# Patient Record
Sex: Male | Born: 1975 | Race: White | Hispanic: No | Marital: Married | Smoking: Former smoker
Health system: Southern US, Community
[De-identification: ages and names within clinical notes are randomized; demographics above are authoritative.]

## PROBLEM LIST (undated history)

## (undated) DIAGNOSIS — I10 Essential (primary) hypertension: Secondary | ICD-10-CM

## (undated) DIAGNOSIS — M1990 Unspecified osteoarthritis, unspecified site: Secondary | ICD-10-CM

## (undated) HISTORY — PX: FRACTURE SURGERY: SHX138

---

## 2004-11-01 ENCOUNTER — Other Ambulatory Visit: Payer: Self-pay

## 2004-11-01 ENCOUNTER — Emergency Department: Payer: Self-pay | Admitting: Emergency Medicine

## 2007-10-22 ENCOUNTER — Ambulatory Visit: Payer: Self-pay | Admitting: Family Medicine

## 2009-05-05 ENCOUNTER — Ambulatory Visit: Payer: Self-pay | Admitting: Family Medicine

## 2009-11-14 ENCOUNTER — Emergency Department: Payer: Self-pay | Admitting: Internal Medicine

## 2010-01-16 ENCOUNTER — Ambulatory Visit: Payer: Self-pay | Admitting: Family Medicine

## 2010-01-22 ENCOUNTER — Ambulatory Visit: Payer: Self-pay | Admitting: Internal Medicine

## 2010-01-25 ENCOUNTER — Ambulatory Visit: Payer: Self-pay | Admitting: Internal Medicine

## 2010-01-30 ENCOUNTER — Ambulatory Visit: Payer: Self-pay | Admitting: Internal Medicine

## 2010-02-05 ENCOUNTER — Ambulatory Visit: Payer: Self-pay | Admitting: Internal Medicine

## 2010-02-12 ENCOUNTER — Ambulatory Visit: Payer: Self-pay | Admitting: Internal Medicine

## 2010-02-16 ENCOUNTER — Ambulatory Visit: Payer: Self-pay | Admitting: Internal Medicine

## 2010-02-26 ENCOUNTER — Ambulatory Visit: Payer: Self-pay

## 2012-01-23 ENCOUNTER — Ambulatory Visit: Payer: Self-pay | Admitting: Family Medicine

## 2012-10-31 ENCOUNTER — Ambulatory Visit: Payer: Self-pay | Admitting: Orthopedic Surgery

## 2014-08-23 ENCOUNTER — Other Ambulatory Visit: Payer: Self-pay | Admitting: Orthopedic Surgery

## 2014-08-23 DIAGNOSIS — M25562 Pain in left knee: Secondary | ICD-10-CM

## 2014-08-25 ENCOUNTER — Ambulatory Visit
Admission: RE | Admit: 2014-08-25 | Discharge: 2014-08-25 | Disposition: A | Discharge status: Home / Self Care | Payer: BLUE CROSS/BLUE SHIELD | Source: Ambulatory Visit | Attending: Orthopedic Surgery | Admitting: Orthopedic Surgery

## 2014-08-25 DIAGNOSIS — M25562 Pain in left knee: Secondary | ICD-10-CM

## 2014-08-25 DIAGNOSIS — X58XXXA Exposure to other specified factors, initial encounter: Secondary | ICD-10-CM | POA: Diagnosis not present

## 2014-08-25 DIAGNOSIS — S83242A Other tear of medial meniscus, current injury, left knee, initial encounter: Secondary | ICD-10-CM | POA: Insufficient documentation

## 2014-08-25 DIAGNOSIS — M25462 Effusion, left knee: Secondary | ICD-10-CM | POA: Insufficient documentation

## 2014-08-25 DIAGNOSIS — S83512D Sprain of anterior cruciate ligament of left knee, subsequent encounter: Secondary | ICD-10-CM | POA: Insufficient documentation

## 2014-08-27 ENCOUNTER — Ambulatory Visit: Payer: Self-pay

## 2014-09-26 ENCOUNTER — Encounter: Payer: Self-pay | Admitting: *Deleted

## 2014-09-26 ENCOUNTER — Inpatient Hospital Stay: Admission: RE | Admit: 2014-09-26 | Payer: BLUE CROSS/BLUE SHIELD | Source: Ambulatory Visit

## 2014-09-26 DIAGNOSIS — X58XXXA Exposure to other specified factors, initial encounter: Secondary | ICD-10-CM | POA: Diagnosis not present

## 2014-09-26 DIAGNOSIS — Z9889 Other specified postprocedural states: Secondary | ICD-10-CM | POA: Diagnosis not present

## 2014-09-26 DIAGNOSIS — S83232A Complex tear of medial meniscus, current injury, left knee, initial encounter: Secondary | ICD-10-CM | POA: Diagnosis not present

## 2014-09-26 DIAGNOSIS — I10 Essential (primary) hypertension: Secondary | ICD-10-CM | POA: Diagnosis not present

## 2014-09-26 DIAGNOSIS — S83512A Sprain of anterior cruciate ligament of left knee, initial encounter: Secondary | ICD-10-CM | POA: Diagnosis not present

## 2014-09-26 DIAGNOSIS — S83242A Other tear of medial meniscus, current injury, left knee, initial encounter: Secondary | ICD-10-CM | POA: Diagnosis present

## 2014-09-26 DIAGNOSIS — M948X6 Other specified disorders of cartilage, lower leg: Secondary | ICD-10-CM | POA: Diagnosis not present

## 2014-09-26 DIAGNOSIS — M1990 Unspecified osteoarthritis, unspecified site: Secondary | ICD-10-CM | POA: Diagnosis not present

## 2014-09-26 DIAGNOSIS — Z87891 Personal history of nicotine dependence: Secondary | ICD-10-CM | POA: Diagnosis not present

## 2014-09-26 DIAGNOSIS — M25562 Pain in left knee: Secondary | ICD-10-CM | POA: Diagnosis not present

## 2014-09-26 NOTE — Patient Instructions (Signed)
  Your procedure is scheduled on: 09-29-14 Report to [facility] 2ND FLOOR To find out your arrival time please call [phone] between 1PM - 3PM on 09-28-14 [facility](WEDNESDAY)  Remember: Instructions that are not followed completely may result in serious medical risk, up to and including death, or upon the discretion of your surgeon and anesthesiologist your surgery may need to be rescheduled.    _X___ 1. Do not eat food or drink liquids after midnight. No gum chewing or hard candies.     _X___ 2. No Alcohol for 24 hours before or after surgery.   ____ 3. Bring all medications with you on the day of surgery if instructed.    _X___ 4. Notify your doctor if there is any change in your medical condition     (cold, fever, infections).     Do not wear jewelry, make-up, hairpins, clips or nail polish.  Do not wear lotions, powders, or perfumes. You may wear deodorant.  Do not shave 48 hours prior to surgery. Men may shave face and neck.  Do not bring valuables to the hospital.    [facility] for any belongings or valuables.               Contacts, dentures or bridgework may not be worn into surgery.  Leave your suitcase in the car. After surgery it may be brought to your room.  For patients admitted to the hospital, discharge time is determined by your treatment team.   Patients discharged the day of surgery will not be allowed to drive home.   Please read over the following fact sheets that you were given:      ____ Take these medicines the morning of surgery with A SIP OF WATER:    1. NONE  2.   3.   4.  5.  6.  ____ Fleet Enema (as directed)   _X___ Use CHG Soap as directed  ____ Use inhalers on the day of surgery  ____ Stop metformin 2 days prior to surgery    ____ Take 1/2 of usual insulin dose the night before surgery and none on the morning of surgery.   ____ Stop Coumadin/Plavix/aspirin-N/A  ____ Stop Anti-inflammatories-NO NSAIDS  OR ASPIRIN PRODUCTS-TYLENOL OK   _X___ Stop supplements until after surgery-STOP VITAMIN C,E AND FISH OIL NOW  ____ Bring C-Pap to the hospital.

## 2014-09-28 ENCOUNTER — Encounter
Admission: RE | Admit: 2014-09-28 | Discharge: 2014-09-28 | Disposition: A | Discharge status: Home / Self Care | Payer: BLUE CROSS/BLUE SHIELD | Source: Ambulatory Visit | Attending: Orthopedic Surgery | Admitting: Orthopedic Surgery

## 2014-09-28 DIAGNOSIS — S83232A Complex tear of medial meniscus, current injury, left knee, initial encounter: Secondary | ICD-10-CM | POA: Diagnosis not present

## 2014-09-28 LAB — BASIC METABOLIC PANEL
ANION GAP: 7 (ref 5–15)
BUN: 19 mg/dL (ref 6–20)
CO2: 28 mmol/L (ref 22–32)
CREATININE: 1.01 mg/dL (ref 0.61–1.24)
Calcium: 9.4 mg/dL (ref 8.9–10.3)
Chloride: 100 mmol/L — ABNORMAL LOW (ref 101–111)
GFR calc non Af Amer: >60 mL/min (ref >60)
GLUCOSE: 113 mg/dL — AB (ref 65–99)
POTASSIUM: 4.1 mmol/L (ref 3.5–5.1)
Sodium: 135 mmol/L (ref 135–145)

## 2014-09-28 LAB — URINALYSIS COMPLETE WITH MICROSCOPIC (ARMC ONLY)
BACTERIA UA: NONE SEEN
Bilirubin Urine: NEGATIVE
GLUCOSE, UA: NEGATIVE mg/dL
HGB URINE DIPSTICK: NEGATIVE
Ketones, ur: NEGATIVE mg/dL
LEUKOCYTES UA: NEGATIVE
NITRITE: NEGATIVE
Protein, ur: NEGATIVE mg/dL
RBC / HPF: NONE SEEN RBC/hpf (ref 0–5)
Specific Gravity, Urine: 1.005 (ref 1.005–1.030)
pH: 6 (ref 5.0–8.0)

## 2014-09-28 LAB — CBC
HCT: 43.3 % (ref 40.0–52.0)
Hemoglobin: 14.5 g/dL (ref 13.0–18.0)
MCH: 31.2 pg (ref 26.0–34.0)
MCHC: 33.6 g/dL (ref 32.0–36.0)
MCV: 92.9 fL (ref 80.0–100.0)
PLATELETS: 181 10*3/uL (ref 150–440)
RBC: 4.66 MIL/uL (ref 4.40–5.90)
RDW: 12.5 % (ref 11.5–14.5)
WBC: 6.5 10*3/uL (ref 3.8–10.6)

## 2014-09-28 LAB — APTT: aPTT: 29 seconds (ref 24–36)

## 2014-09-28 LAB — PROTIME-INR
INR: 1.05
Prothrombin Time: 13.9 seconds (ref 11.4–15.0)

## 2014-09-29 ENCOUNTER — Encounter
Admission: RE | Disposition: A | Discharge status: Home / Self Care | Payer: Self-pay | Source: Ambulatory Visit | Attending: Orthopedic Surgery

## 2014-09-29 ENCOUNTER — Ambulatory Visit: Payer: BLUE CROSS/BLUE SHIELD | Admitting: Anesthesiology

## 2014-09-29 ENCOUNTER — Encounter: Payer: Self-pay | Admitting: Anesthesiology

## 2014-09-29 ENCOUNTER — Ambulatory Visit
Admission: RE | Admit: 2014-09-29 | Discharge: 2014-09-29 | Disposition: A | Discharge status: Home / Self Care | Payer: BLUE CROSS/BLUE SHIELD | Source: Ambulatory Visit | Attending: Orthopedic Surgery | Admitting: Orthopedic Surgery

## 2014-09-29 DIAGNOSIS — M948X6 Other specified disorders of cartilage, lower leg: Secondary | ICD-10-CM | POA: Insufficient documentation

## 2014-09-29 DIAGNOSIS — I10 Essential (primary) hypertension: Secondary | ICD-10-CM | POA: Insufficient documentation

## 2014-09-29 DIAGNOSIS — Z9889 Other specified postprocedural states: Secondary | ICD-10-CM | POA: Insufficient documentation

## 2014-09-29 DIAGNOSIS — Z87891 Personal history of nicotine dependence: Secondary | ICD-10-CM | POA: Insufficient documentation

## 2014-09-29 DIAGNOSIS — M1990 Unspecified osteoarthritis, unspecified site: Secondary | ICD-10-CM | POA: Insufficient documentation

## 2014-09-29 DIAGNOSIS — S83512A Sprain of anterior cruciate ligament of left knee, initial encounter: Secondary | ICD-10-CM | POA: Insufficient documentation

## 2014-09-29 DIAGNOSIS — S83232A Complex tear of medial meniscus, current injury, left knee, initial encounter: Secondary | ICD-10-CM | POA: Insufficient documentation

## 2014-09-29 DIAGNOSIS — M25562 Pain in left knee: Secondary | ICD-10-CM | POA: Insufficient documentation

## 2014-09-29 DIAGNOSIS — X58XXXA Exposure to other specified factors, initial encounter: Secondary | ICD-10-CM | POA: Insufficient documentation

## 2014-09-29 HISTORY — PX: KNEE ARTHROSCOPY WITH ANTERIOR CRUCIATE LIGAMENT (ACL) REPAIR: SHX5644

## 2014-09-29 HISTORY — DX: Unspecified osteoarthritis, unspecified site: M19.90

## 2014-09-29 HISTORY — DX: Essential (primary) hypertension: I10

## 2014-09-29 SURGERY — KNEE ARTHROSCOPY WITH ANTERIOR CRUCIATE LIGAMENT (ACL) REPAIR
Anesthesia: General | Site: Knee | Laterality: Left | Wound class: Clean

## 2014-09-29 MED ORDER — BUPIVACAINE HCL (PF) 0.25 % IJ SOLN
INTRAMUSCULAR | Status: DC | PRN
  Administered 2014-09-29: 30 mL

## 2014-09-29 MED ORDER — LIDOCAINE HCL (CARDIAC) 20 MG/ML IV SOLN
INTRAVENOUS | Status: DC | PRN
  Administered 2014-09-29: 30 mg via INTRAVENOUS

## 2014-09-29 MED ORDER — LIDOCAINE HCL 1 % IJ SOLN
INTRAMUSCULAR | Status: DC | PRN
  Administered 2014-09-29: 4 mL

## 2014-09-29 MED ORDER — FENTANYL CITRATE (PF) 100 MCG/2ML IJ SOLN
INTRAMUSCULAR | Status: DC | PRN
  Administered 2014-09-29 (×2): 50 ug via INTRAVENOUS

## 2014-09-29 MED ORDER — FAMOTIDINE 20 MG PO TABS
20.0000 mg | ORAL_TABLET | Freq: Once | ORAL | Status: AC
  Administered 2014-09-29: 20 mg via ORAL

## 2014-09-29 MED ORDER — LACTATED RINGERS IV SOLN
INTRAVENOUS | Status: DC | PRN
  Administered 2014-09-29: 10:00:00 via INTRAVENOUS

## 2014-09-29 MED ORDER — BUPIVACAINE HCL (PF) 0.25 % IJ SOLN
INTRAMUSCULAR | Status: AC
  Filled 2014-09-29: qty 30

## 2014-09-29 MED ORDER — FENTANYL CITRATE (PF) 100 MCG/2ML IJ SOLN
25.0000 ug | INTRAMUSCULAR | Status: DC | PRN
  Administered 2014-09-29 (×4): 25 ug via INTRAVENOUS

## 2014-09-29 MED ORDER — CEFAZOLIN SODIUM-DEXTROSE 2-3 GM-% IV SOLR
INTRAVENOUS | Status: AC
  Filled 2014-09-29: qty 50

## 2014-09-29 MED ORDER — LIDOCAINE HCL (PF) 1 % IJ SOLN
INTRAMUSCULAR | Status: AC
  Filled 2014-09-29: qty 30

## 2014-09-29 MED ORDER — ONDANSETRON HCL 4 MG/2ML IJ SOLN
INTRAMUSCULAR | Status: DC | PRN
  Administered 2014-09-29: 4 mg via INTRAVENOUS

## 2014-09-29 MED ORDER — ASPIRIN EC 325 MG PO TBEC: 325.0000 mg | DELAYED_RELEASE_TABLET | Freq: Every day | ORAL | Status: DC

## 2014-09-29 MED ORDER — FENTANYL CITRATE (PF) 100 MCG/2ML IJ SOLN
INTRAMUSCULAR | Status: AC
  Administered 2014-09-29: 25 ug via INTRAVENOUS
  Filled 2014-09-29: qty 2

## 2014-09-29 MED ORDER — EPINEPHRINE 30 MG/30ML IJ SOLN
INTRAMUSCULAR | Status: DC | PRN
  Administered 2014-09-29: 12 mL

## 2014-09-29 MED ORDER — PROMETHAZINE HCL 12.5 MG PO TABS: 12.5000 mg | ORAL_TABLET | ORAL | Status: DC | PRN

## 2014-09-29 MED ORDER — NEOMYCIN-POLYMYXIN B GU 40-200000 IR SOLN
Status: AC
  Filled 2014-09-29: qty 4

## 2014-09-29 MED ORDER — MIDAZOLAM HCL 2 MG/2ML IJ SOLN
INTRAMUSCULAR | Status: DC | PRN
  Administered 2014-09-29: 2 mg via INTRAVENOUS

## 2014-09-29 MED ORDER — NEOMYCIN-POLYMYXIN B GU 40-200000 IR SOLN
Status: DC | PRN
  Administered 2014-09-29: 4 mL

## 2014-09-29 MED ORDER — ACETAMINOPHEN 325 MG PO TABS: 650.0000 mg | ORAL_TABLET | ORAL | Status: DC | PRN

## 2014-09-29 MED ORDER — LACTATED RINGERS IV SOLN
Freq: Once | INTRAVENOUS | Status: AC
  Administered 2014-09-29: 09:00:00 via INTRAVENOUS

## 2014-09-29 MED ORDER — PROPOFOL 10 MG/ML IV BOLUS
INTRAVENOUS | Status: DC | PRN
  Administered 2014-09-29: 150 mg via INTRAVENOUS

## 2014-09-29 MED ORDER — OXYCODONE HCL 5 MG PO TABS
5.0000 mg | ORAL_TABLET | ORAL | Status: DC | PRN
  Administered 2014-09-29: 5 mg via ORAL

## 2014-09-29 MED ORDER — OXYCODONE HCL 5 MG PO TABS: 5.0000 mg | ORAL_TABLET | ORAL | Status: DC | PRN

## 2014-09-29 MED ORDER — CEFAZOLIN SODIUM-DEXTROSE 2-3 GM-% IV SOLR
2.0000 g | Freq: Once | INTRAVENOUS | Status: AC
  Administered 2014-09-29: 2 g via INTRAVENOUS

## 2014-09-29 MED ORDER — GLYCOPYRROLATE 0.2 MG/ML IJ SOLN
INTRAMUSCULAR | Status: DC | PRN
  Administered 2014-09-29: 0.2 mg via INTRAVENOUS

## 2014-09-29 MED ORDER — ONDANSETRON HCL 4 MG/2ML IJ SOLN: 4.0000 mg | Freq: Once | INTRAMUSCULAR | Status: DC | PRN

## 2014-09-29 MED ORDER — FAMOTIDINE 20 MG PO TABS
ORAL_TABLET | ORAL | Status: AC
  Administered 2014-09-29: 20 mg via ORAL
  Filled 2014-09-29: qty 1

## 2014-09-29 MED ORDER — EPINEPHRINE HCL 1 MG/ML IJ SOLN
INTRAMUSCULAR | Status: AC
  Filled 2014-09-29: qty 1

## 2014-09-29 MED ORDER — OXYCODONE HCL 5 MG PO TABS
ORAL_TABLET | ORAL | Status: AC
  Filled 2014-09-29: qty 1

## 2014-09-29 SURGICAL SUPPLY — 82 items
ADAPTER IRRIG TUBE 2 SPIKE SOL (ADAPTER) ×4 IMPLANT
ANCHOR BUTTON TIGHTROPE ACL RT (Orthopedic Implant) ×2 IMPLANT
ANCHOR SUPER #2 ORTHOCORD (MISCELLANEOUS) IMPLANT
BASIN GRAD PLASTIC 32OZ STRL (MISCELLANEOUS) ×2 IMPLANT
BIT DRILL PIN RETRO (DRILL) ×1 IMPLANT
BLADE SURG 15 STRL LF DISP TIS (BLADE) ×2 IMPLANT
BLADE SURG 15 STRL SS (BLADE) ×2
BLADE SURG SZ11 CARB STEEL (BLADE) ×2 IMPLANT
BNDG COHESIVE 4X5 TAN STRL (GAUZE/BANDAGES/DRESSINGS) ×2 IMPLANT
BNDG COHESIVE 6X5 TAN STRL LF (GAUZE/BANDAGES/DRESSINGS) ×2 IMPLANT
BNDG ESMARK 6X12 TAN STRL LF (GAUZE/BANDAGES/DRESSINGS) IMPLANT
BUR RADIUS 3.5 (BURR) IMPLANT
BUR RADIUS 4.0X18.5 (BURR) ×2 IMPLANT
BUR RADIUS 5.5 (BURR) ×2 IMPLANT
CLEANER CAUTERY TIP 5X5 PAD (MISCELLANEOUS) ×1 IMPLANT
COOLER POLAR GLACIER W/PUMP (MISCELLANEOUS) ×2 IMPLANT
CUTTER DUAL RETRO 10.5 (CUTTER) ×2 IMPLANT
CUTTER REFRO DUAL 10MM (CUTTER) ×2 IMPLANT
DRAPE FLUOR MINI C-ARM 54X84 (DRAPES) ×2 IMPLANT
DRAPE IMP U-DRAPE 54X76 (DRAPES) ×4 IMPLANT
DRAPE INCISE IOBAN 66X45 STRL (DRAPES) IMPLANT
DRAPE SHEET LG 3/4 BI-LAMINATE (DRAPES) ×2 IMPLANT
DRAPE TABLE BACK 80X90 (DRAPES) ×2 IMPLANT
DRAPE U-SHAPE 47X51 STRL (DRAPES) IMPLANT
DRILL FLIPCUTTER II 8.0MM (INSTRUMENTS) IMPLANT
DRILL FLIPCUTTER II 9.0MM (INSTRUMENTS) ×1 IMPLANT
DRILL PIN RETRO (DRILL) ×2
DURAPREP 26ML APPLICATOR (WOUND CARE) ×4 IMPLANT
FLIPCUTTER II 8.0MM (INSTRUMENTS)
FLIPCUTTER II 9.0MM (INSTRUMENTS) ×2
GAUZE PETRO XEROFOAM 1X8 (MISCELLANEOUS) IMPLANT
GAUZE SPONGE 4X4 12PLY STRL (GAUZE/BANDAGES/DRESSINGS) ×2 IMPLANT
GLOVE BIOGEL PI IND STRL 9 (GLOVE) ×3 IMPLANT
GLOVE BIOGEL PI INDICATOR 9 (GLOVE) ×3
GLOVE SURG 9.0 ORTHO LTXF (GLOVE) ×8 IMPLANT
GOWN STRL REUS W/ TWL LRG LVL3 (GOWN DISPOSABLE) ×2 IMPLANT
GOWN STRL REUS W/TWL 2XL LVL3 (GOWN DISPOSABLE) ×2 IMPLANT
GOWN STRL REUS W/TWL LRG LVL3 (GOWN DISPOSABLE) ×2
GUIDEWIRE 1.1MM (WIRE) ×2 IMPLANT
HANDLE YANKAUER SUCT BULB TIP (MISCELLANEOUS) ×2 IMPLANT
IV LACTATED RINGER IRRG 3000ML (IV SOLUTION) ×12
IV LR IRRIG 3000ML ARTHROMATIC (IV SOLUTION) ×12 IMPLANT
KIT RM TURNOVER STRD PROC AR (KITS) ×2 IMPLANT
LABEL OR SOLS (LABEL) ×2 IMPLANT
MAT BLUE FLOOR 46X72 FLO (MISCELLANEOUS) ×4 IMPLANT
NDL SAFETY ECLIPSE 18X1.5 (NEEDLE) ×1 IMPLANT
NEEDLE FILTER BLUNT 18X 1/2SAF (NEEDLE) ×1
NEEDLE FILTER BLUNT 18X1 1/2 (NEEDLE) ×1 IMPLANT
NEEDLE HYPO 18GX1.5 SHARP (NEEDLE) ×1
NEPTUNE MANIFOLD (MISCELLANEOUS) ×2 IMPLANT
PACK ARTHROSCOPY KNEE (MISCELLANEOUS) ×2 IMPLANT
PAD ABD DERMACEA PRESS 5X9 (GAUZE/BANDAGES/DRESSINGS) IMPLANT
PAD CLEANER CAUTERY TIP 5X5 (MISCELLANEOUS) ×1
PAD GROUND ADULT SPLIT (MISCELLANEOUS) ×2 IMPLANT
PAD WRAPON POLAR KNEE (MISCELLANEOUS) ×1 IMPLANT
PENCIL ELECTRO HAND CTR (MISCELLANEOUS) ×2 IMPLANT
SCREW BIO FULL THREADED 10X28 (Screw) ×2 IMPLANT
SET TUBE SUCT SHAVER OUTFL 24K (TUBING) ×2 IMPLANT
SET TUBE TIP INTRA-ARTICULAR (MISCELLANEOUS) ×2 IMPLANT
STAPLE SPIKE LIGAMENT 11X20MM (Staple) ×2 IMPLANT
STRIP CLOSURE SKIN 1/2X4 (GAUZE/BANDAGES/DRESSINGS) ×4 IMPLANT
SUCTION FRAZIER TIP 10 FR DISP (SUCTIONS) ×2 IMPLANT
SUT 2 FIBERLOOP 20 STRT BLUE (SUTURE) ×4
SUT ETHILON 4-0 (SUTURE) ×1
SUT ETHILON 4-0 FS2 18XMFL BLK (SUTURE) ×1
SUT FIBERWIRE #2 38 T-5 BLUE (SUTURE) ×4
SUT MNCRL AB 4-0 PS2 18 (SUTURE) ×2 IMPLANT
SUT ORTHOCORD 2X36 W/O NDL (SUTURE) IMPLANT
SUT VIC AB 0 CT1 36 (SUTURE) ×2 IMPLANT
SUT VIC AB 2-0 CT2 27 (SUTURE) ×2 IMPLANT
SUT VIC AB 2-0 SH 27 (SUTURE)
SUT VIC AB 2-0 SH 27XBRD (SUTURE) IMPLANT
SUTURE 2 FIBERLOOP 20 STRT BLU (SUTURE) ×2 IMPLANT
SUTURE ETHLN 4-0 FS2 18XMF BLK (SUTURE) ×1 IMPLANT
SUTURE FIBERWR #2 38 T-5 BLUE (SUTURE) ×2 IMPLANT
SYR BULB IRRIG 60ML STRL (SYRINGE) ×2 IMPLANT
SYRINGE 10CC LL (SYRINGE) ×4 IMPLANT
TAPE UMBIL 1/8X18 RADIOPA (MISCELLANEOUS) ×2 IMPLANT
TUBING ARTHRO INFLOW-ONLY STRL (TUBING) ×2 IMPLANT
TUBING CONNECTING 10 (TUBING) IMPLANT
WAND HAND CNTRL MULTIVAC 90 (MISCELLANEOUS) ×2 IMPLANT
WRAPON POLAR PAD KNEE (MISCELLANEOUS) ×2

## 2014-09-29 NOTE — Anesthesia Preprocedure Evaluation (Addendum)
Anesthesia Evaluation  Patient identified by MRN, date of birth, ID band Patient awake    Reviewed: Allergy & Precautions, NPO status , Patient's Chart, lab work & pertinent test results, reviewed documented beta blocker date and time   Airway Mallampati: II  TM Distance: >3 FB     Dental  (+) Chipped   Pulmonary former smoker,          Cardiovascular hypertension,     Neuro/Psych    GI/Hepatic   Endo/Other    Renal/GU      Musculoskeletal  (+) Arthritis -,   Abdominal   Peds  Hematology   Anesthesia Other Findings   Reproductive/Obstetrics                            Anesthesia Physical Anesthesia Plan  ASA: II  Anesthesia Plan: General   Post-op Pain Management:    Induction: Intravenous  Airway Management Planned: LMA  Additional Equipment:   Intra-op Plan:   Post-operative Plan:   Informed Consent: I have reviewed the patients History and Physical, chart, labs and discussed the procedure including the risks, benefits and alternatives for the proposed anesthesia with the patient or authorized representative who has indicated his/her understanding and acceptance.     Plan Discussed with: CRNA  Anesthesia Plan Comments:        Anesthesia Quick Evaluation

## 2014-09-29 NOTE — Anesthesia Procedure Notes (Signed)
Procedure Name: LMA Insertion Date/Time: 09/29/2014 10:20 AM Performed by: Charna BusmanIAMOND, Gaudencio Chesnut Pre-anesthesia Checklist: Patient identified, Emergency Drugs available, Suction available, Patient being monitored and Timeout performed Patient Re-evaluated:Patient Re-evaluated prior to inductionOxygen Delivery Method: Circle system utilized Preoxygenation: Pre-oxygenation with 100% oxygen Intubation Type: Combination inhalational/ intravenous induction Ventilation: Mask ventilation without difficulty LMA: LMA inserted LMA Size: 5.0 Grade View: Grade II Tube type: Oral Number of attempts: 1 Tube secured with: Tape Dental Injury: Teeth and Oropharynx as per pre-operative assessment

## 2014-09-29 NOTE — OR Nursing (Signed)
Toes warm and pink pedal pulses positive

## 2014-09-29 NOTE — Op Note (Signed)
09/29/2014  2:15 PM  PATIENT:  Kenneth Simmons    PRE-OPERATIVE DIAGNOSIS:  COMPLETE TEAR,KNEE,ANTERIOR CRUCIATE LIGAMENT, medial meniscus tear and grade IV chondral lesion medial femoral condyle,  LEFT KNEE  POST-OPERATIVE DIAGNOSIS:  Same  PROCEDURE:  LEFT KNEE ARTHROSCOPY WITH ANTERIOR CRUCIATE LIGAMENT (ACL) REPAIR WITH HAMSTRING AUTOGRAFT, PARTIAL MEDIAL MENISCECTOMY AND MICROFRACTURE OF MEDIAL FEMORAL CONDYLE  SURGEON:  Juanell FairlyKRASINSKI, Manie Bealer, MD  ANESTHESIA:   General  PREOPERATIVE INDICATIONS:  Kenneth Simmons is a  39 y.o. male with a diagnosis of COMPLETE TEAR,KNEE,ANTERIOR CRUCIATE LIGAMENT LEFT KNEE who failed conservative measures and elected for surgical management due to persistent pain and instability of the left knee.  The risks benefits and alternatives were discussed with the patient preoperatively including but not limited to the risks of infection, bleeding, nerve or blood vessel injury nerve injury, stiffness, persistent pain or instability,, osteoarthritis, hardware failure, re-tear of the anterior cruciate ligament or meniscus and the need for further surgery. Medical risks include but are not limited to DVT and pulmonary embolism, myocardial infarction, stroke, pneumonia, respiratory failure and death and the potential for use of a allograft and related disease transmission risks.  OPERATIVE IMPLANTS: Arthrex anterior cruciate ligament tightrope RT, 10 x 28 mm bio composite tibial interference screw and 11mm spiked staple.  OPERATIVE FINDINGS: The anterior cruciate ligament was completely torn. The PCL was intact. The medial meniscus had a complex tear involving the posterior horn. There was a grade 4, full-thickness chondral lesion to the weightbearing surface of the medial femoral condyle.  OPERATIVE PROCEDURE: The patient was brought to the operating room and placed in the supine position. General anesthesia was administered. IV antibiotics were given. The lower extremity was  prepped and draped in usual sterile fashion. Exam under anesthesia demonstrated the above-named findings. Time out was performed.  Arthroscopy portals were then created in a vertical fashion on either side of patellar tendon. The arthroscope was placed in the lateral portal and full diagnostic examination the knee was undertaken. The findings are noted above.  The medial meniscus tear was treated using a straight duckbill biter and a 40 resector shaver blade. The meniscus was contoured and all meniscal debris was removed.  The anterior cruciate ligament stump was then debrided again with a 4 resector shaver blade. A 5.5 mm resector shaver blade was used to perform a notchplasty. The lateral compartment was visualized and was not found to have any evidence of chondral injury or meniscal tear. All arthroscopic instruments were removed.  An vertical incision was then made over the anterior tibia. The soft tissue was dissected using Metzenbaum scissor and pickup. The sartorius fascia was identified and incised and reflected to allow for visualization of the hamstring tendons.  The semitendinosus and gracilis were harvested using a tendon stripper. These were prepared on the back table and sutured with a fiber loop. The combined diameter of the tendons was measured to be 9 mm on the femoral side and 10 mm on the tibial side. The total graft length for both the semitendinosus and gracilis was 220 mm. . Good-quality tissue was obtained. The sartorius fascia was performed preserved for repair later.  The  Femoral drill guide was then placed through the lateral portal appeared case small incision was made over the lateral femur to allow for placement of this drill guide along the femoral cortex.   A flip cutter guide was then used to create a 9 mm diameter femoral tunnel.  Care was taken to maintain the  cortical bridge.  A fiber stick suture was then shuttled through the femoral drill guide to allow for later  shuttling of the graft through the knee. This was brought out through the tibial tunnel once that was created.   I then drilled the tibial tunnel using the size 10 retro-cutter. All the soft tissue remnants were removed and cleaned at the aperture of the tunnel. the tunnel was dilated to 10 mm.   The passing suture was delivered through the tibia, and then the Arthrex RT  button and graft were delivered up into the femoral tunnel the of the tibial tunnel. .  The button was flipped and confirmed under live fluoroscopy. I then tensioned the anterior cruciate ligament tightrope, and advanced the graft up into the femoral tunnel. Over 30 mm of graft was in the femoral tunnel. I confirmed once more with the fluoroscopy that the button was flipped appropriately on the femoral cortex.  I then cycled the knee, eliminated all of the creep. I then applied  counter tension to the graft from the tibial side as an  Arthrex bio composite interference screw was advanced into the tibial tunnel while a posterior drawer force was applied to the tibia. I removed the nitinol wire prior to completely seating the screw.11 mm spiked staple was used to backup the tibial fixation.   Excellent fixation was achieved on both the femoral and tibial side.  The patient had a firm endpoint on Lachman's test and no anterior laxity after the anterior cruciate ligament reconstruction. Final arthroscopic images of the anterior cruciate ligament reconstruction were performed.  The full-thickness chondral lesion of medial femoral condyle was then addressed. A microfracture was performed using awls applied to the exposed bone surface after ringed curettes had been used to create a stable chondral border. Bleeding was observed from these microfracture holes upon elimination of fluid pressure. Final images of the microfracture were taken.   The wounds were irrigated copiously and the sartorius fascia repaired with  with 2-0 Vicryl, and the  portals repaired with  4-0 nylon and the tibial incision was closed with 4-0 Monocryl.   Steri-Strips and a dry sterile dressing were applied. Patient had an intra-articular injection with cortical percent Marcaine plain for postop pain control. TENS unit pads were applied along with a Polar Care sleeve and a Breg hinge knee brace locked in extension..  The patient was awakened and returned to PACU in stable and satisfactory condition. There were no complications and He tolerated the procedure well. with the patient's family in the postop consultation room to let them know the case had gone without complication and the patient was stable in the recovery room.

## 2014-09-29 NOTE — Transfer of Care (Signed)
Immediate Anesthesia Transfer of Care Note  Patient: Kenneth Simmons  Procedure(s) Performed: Procedure(s): KNEE ARTHROSCOPY WITH ANTERIOR CRUCIATE LIGAMENT (ACL) REPAIR (Left)  Patient Location: PACU  Anesthesia Type:General  Level of Consciousness: sedated and patient cooperative  Airway & Oxygen Therapy: Patient Spontanous Breathing and Patient connected to face mask oxygen  Post-op Assessment: Report given to RN and Post -op Vital signs reviewed and stable  Post vital signs: Reviewed and stable  Last Vitals:  Filed Vitals:   09/29/14 1411  BP:   Pulse: 106  Temp: 36.9 C  Resp:     Complications: No apparent anesthesia complications

## 2014-09-29 NOTE — H&P (Signed)
PREOPERATIVE H&P  Chief Complaint: COMPLETE TEAR,KNEE,ANTERIOR CRUCIATE LIGAMENT  HPI: Kenneth Simmons is a 39 y.o. male who presents for preoperative history and physical with a diagnosis of LEFT KNEE COMPLETE TEAR,KNEE,ANTERIOR CRUCIATE LIGAMENT. Symptoms include gross instability and pain and are rated as moderate to severe, and have been worsening.  This is significantly impairing activities of daily living.  He has elected for surgical management after failing nonoperative treatment.   Past Medical History  Diagnosis Date  . Hypertension   . Arthritis    Past Surgical History  Procedure Laterality Date  . Fracture surgery      hand    History   Social History  . Marital Status: Married    Spouse Name: N/A  . Number of Children: N/A  . Years of Education: N/A   Social History Main Topics  . Smoking status: Former Smoker -- 1.50 packs/day for 12 years    Types: Cigarettes  . Smokeless tobacco: Not on file  . Alcohol Use: Yes     Comment: occ  . Drug Use: No  . Sexual Activity: Not on file   Other Topics Concern  . None   Social History Narrative   History reviewed. No pertinent family history. No Known Allergies Prior to Admission medications   Medication Sig Start Date End Date Taking? Authorizing Provider  lisinopril-hydrochlorothiazide (PRINZIDE,ZESTORETIC) 20-12.5 MG per tablet Take 1 tablet by mouth daily.   Yes Historical Provider, MD  Multiple Vitamin (MULTIVITAMIN) capsule Take 1 capsule by mouth daily.   Yes Historical Provider, MD  Omega-3 Fatty Acids (FISH OIL PO) Take 1 capsule by mouth daily.   Yes Historical Provider, MD  vitamin C (ASCORBIC ACID) 500 MG tablet Take 500 mg by mouth daily.   Yes Historical Provider, MD  vitamin E 400 UNIT capsule Take 400 Units by mouth daily.   Yes Historical Provider, MD     Positive ROS: All other systems have been reviewed and were otherwise negative with the exception of those mentioned in the HPI and as  above.  Physical Exam: General: Alert, no acute distress Cardiovascular: Regular rate and rhythm, no murmurs rubs or gallops.  No pedal edema Respiratory: Clear to auscultation bilaterally, no wheezes rales or rhonchi. No cyanosis, no use of accessory musculature GI: No organomegaly, abdomen is soft and non-tender nondistended with positive bowel sounds. Skin: Skin intact, no lesions within the operative field. Neurologic: Sensation intact distally Psychiatric: Patient is competent for consent with normal mood and affect Lymphatic: No axillary or cervical lymphadenopathy  MUSCULOSKELETAL: Left knee: Skin is intact there is no erythema or ecchymosis. Patient has a trace effusion. Rate range of motion is 0-120. He has a positive Lachman's and anterior drawer test. There is no instability to varus or valgus stress testing at 0 and 30 of flexion. He has no calf tenderness or lower leg edema. He has palpable pedal pulses. Patient has 5 out of 5 strength in all muscle groups of the lower extremity and can perform a straight leg raise without lag.  Assessment: LEFT KNEE COMPLETE TEAR,KNEE,ANTERIOR CRUCIATE LIGAMENT, TORN MEDIAL MENISCUS AND CHONDRAL LESION OF MEDIAL FEMORAL CONDYLE  Plan: Plan for Procedure(s): LEFT KNEE ARTHROSCOPY WITH ANTERIOR CRUCIATE LIGAMENT (ACL) REPAIR, partial medial meniscectomy and possible microfracture of the medial femoral condyle  Mr. 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 He currently has pain and instability particularly in his left knee which is affecting affecting his ability  to perform at work and his activities of daily living. He feels gross instability to his knee. He has tried nonoperative management including activity modification nonsteroidal anti-inflammatory, cortical steroids injection with bracing. Patient wishes to proceed with surgical management at this time.  I discussed the risks and benefits of  surgery. The risks include but are not limited to infection, bleeding requiring blood transfusion, nerve or blood vessel injury, joint stiffness or loss of motion, persistent pain, weakness or instability, malunion, nonunion and hardware failure and the need for further surgery. Medical risks include but are not limited to DVT and pulmonary embolism, myocardial infarction, stroke, pneumonia, respiratory failure and death. Patient understood these risks and wished to proceed.   Juanell Fairly, MD   09/29/2014 9:38 AM

## 2014-09-29 NOTE — Anesthesia Postprocedure Evaluation (Signed)
  Anesthesia Post-op Note  Patient: Kenneth Simmons  Procedure(s) Performed: Procedure(s): KNEE ARTHROSCOPY WITH ANTERIOR CRUCIATE LIGAMENT (ACL) REPAIR (Left)  Anesthesia type:General  Patient location: PACU  Post pain: Pain level controlled  Post assessment: Post-op Vital signs reviewed, Patient's Cardiovascular Status Stable, Respiratory Function Stable, Patent Airway and No signs of Nausea or vomiting  Post vital signs: Reviewed and stable  Last Vitals:  Filed Vitals:   09/29/14 1527  BP: 166/75  Pulse: 100  Temp: 36.6 C  Resp: 16    Level of consciousness: awake, alert  and patient cooperative  Complications: No apparent anesthesia complications

## 2014-09-30 ENCOUNTER — Encounter: Payer: Self-pay | Admitting: Orthopedic Surgery

## 2015-02-02 ENCOUNTER — Other Ambulatory Visit: Payer: Self-pay

## 2015-02-02 DIAGNOSIS — I10 Essential (primary) hypertension: Secondary | ICD-10-CM

## 2015-02-02 MED ORDER — LISINOPRIL-HYDROCHLOROTHIAZIDE 20-12.5 MG PO TABS: 1.0000 | ORAL_TABLET | Freq: Every day | ORAL | Status: DC

## 2015-02-03 ENCOUNTER — Other Ambulatory Visit: Payer: Self-pay | Admitting: Family Medicine

## 2015-03-22 ENCOUNTER — Encounter: Payer: Self-pay | Admitting: Family Medicine

## 2015-03-22 ENCOUNTER — Ambulatory Visit (INDEPENDENT_AMBULATORY_CARE_PROVIDER_SITE_OTHER): Payer: BLUE CROSS/BLUE SHIELD | Admitting: Family Medicine

## 2015-03-22 VITALS — BP 120/80 | HR 80 | Ht 65.0 in | Wt 216.0 lb

## 2015-03-22 DIAGNOSIS — E785 Hyperlipidemia, unspecified: Secondary | ICD-10-CM | POA: Diagnosis not present

## 2015-03-22 DIAGNOSIS — I10 Essential (primary) hypertension: Secondary | ICD-10-CM | POA: Diagnosis not present

## 2015-03-22 NOTE — Progress Notes (Signed)
Name: Kenneth Simmons   MRN: [id number]    DOB: [birth year]   Date:03/22/2015       Progress Note  Subjective  Chief Complaint  Chief Complaint  Patient presents with  . Hypertension    Hypertension This is a chronic problem. The current episode started more than 1 year ago. The problem has been waxing and waning since onset. The problem is controlled. Pertinent negatives include no anxiety, blurred vision, chest pain, headaches, malaise/fatigue, neck pain, orthopnea, palpitations, peripheral edema, PND, shortness of breath or sweats. There are no associated agents to hypertension. Risk factors for coronary artery disease include dyslipidemia and male gender. Past treatments include ACE inhibitors and diuretics. The current treatment provides moderate improvement. There are no compliance problems.  There is no history of angina, kidney disease, CAD/MI, CVA, heart failure, left ventricular hypertrophy, PVD, renovascular disease or retinopathy. There is no history of chronic renal disease or a hypertension causing med.    No problem-specific assessment & plan notes found for this encounter.   Past Medical History  Diagnosis Date  . Hypertension   . Arthritis     Past Surgical History  Procedure Laterality Date  . Fracture surgery      hand   . Knee arthroscopy with anterior cruciate ligament (acl) repair Left 09/29/2014    Procedure: KNEE ARTHROSCOPY WITH ANTERIOR CRUCIATE LIGAMENT (ACL) REPAIR;  Surgeon: Juanell Fairly, MD;  Location: [facility] ORS;  Service: Orthopedics;  Laterality: Left;    History reviewed. No pertinent family history.  Social History   Social History  . Marital Status: Married    Spouse Name: N/A  . Number of Children: N/A  . Years of Education: N/A   Occupational History  . Not on file.   Social History Main Topics  . Smoking status: Former Smoker -- 1.50 packs/day for 12 years    Types: Cigarettes  . Smokeless tobacco: Not on file  . Alcohol Use: Yes      Comment: occ  . Drug Use: No  . Sexual Activity: Yes   Other Topics Concern  . Not on file   Social History Narrative    No Known Allergies   Review of Systems  Constitutional: Negative for fever, chills, weight loss and malaise/fatigue.  HENT: Negative for ear discharge, ear pain and sore throat.   Eyes: Negative for blurred vision.  Respiratory: Negative for cough, sputum production, shortness of breath and wheezing.   Cardiovascular: Negative for chest pain, palpitations, orthopnea, leg swelling and PND.  Gastrointestinal: Negative for heartburn, nausea, abdominal pain, diarrhea, constipation, blood in stool and melena.  Genitourinary: Negative for dysuria, urgency, frequency and hematuria.  Musculoskeletal: Negative for myalgias, back pain, joint pain and neck pain.  Skin: Negative for rash.  Neurological: Negative for dizziness, tingling, sensory change, focal weakness and headaches.  Endo/Heme/Allergies: Negative for environmental allergies and polydipsia. Does not bruise/bleed easily.  Psychiatric/Behavioral: Negative for depression and suicidal ideas. The patient is not nervous/anxious and does not have insomnia.      Objective  Filed Vitals:   03/22/15 0917  BP: 120/80  Pulse: 80  Height:  (1.651 m)  Weight: 216 lb (97.977 kg)    Physical Exam  Constitutional: He is oriented to person, place, and time and well-developed, well-nourished, and in no distress.  HENT:  Head: Normocephalic.  Right Ear: External ear normal.  Left Ear: External ear normal.  Nose: Nose normal.  Mouth/Throat: Oropharynx is clear and moist.  Eyes: Conjunctivae and EOM  are normal. Pupils are equal, round, and reactive to light. Right eye exhibits no discharge. Left eye exhibits no discharge. No scleral icterus.  Neck: Normal range of motion. Neck supple. No JVD present. No tracheal deviation present. No thyromegaly present.  Cardiovascular: Normal rate, regular rhythm, normal  heart sounds and intact distal pulses.  Exam reveals no gallop and no friction rub.   No murmur heard. Pulmonary/Chest: Breath sounds normal. No respiratory distress. He has no wheezes. He has no rales.  Abdominal: Soft. Bowel sounds are normal. He exhibits no mass. There is no hepatosplenomegaly. There is no tenderness. There is no rebound, no guarding and no CVA tenderness.  Musculoskeletal: Normal range of motion. He exhibits no edema or tenderness.  Lymphadenopathy:    He has no cervical adenopathy.  Neurological: He is alert and oriented to person, place, and time. He has normal sensation, normal strength, normal reflexes and intact cranial nerves. No cranial nerve deficit.  Skin: Skin is warm. No rash noted.  Psychiatric: Mood and affect normal.  Nursing note and vitals reviewed.     Assessment & Plan  Problem List Items Addressed This Visit    None    Visit Diagnoses    Essential hypertension    -  Primary    Hyperlipidemia        Relevant Orders    Lipid Profile         Dr. Elizabeth Sauereanna Jones [facility]  03/22/2015

## 2015-03-23 LAB — LIPID PANEL
CHOL/HDL RATIO: 4.9 ratio (ref 0.0–5.0)
Cholesterol, Total: 230 mg/dL — ABNORMAL HIGH (ref 100–199)
HDL: 47 mg/dL (ref >39)
LDL CALC: 147 mg/dL — AB (ref 0–99)
Triglycerides: 180 mg/dL — ABNORMAL HIGH (ref 0–149)
VLDL CHOLESTEROL CAL: 36 mg/dL (ref 5–40)

## 2015-04-22 ENCOUNTER — Other Ambulatory Visit: Payer: Self-pay | Admitting: Family Medicine

## 2015-05-17 ENCOUNTER — Other Ambulatory Visit: Payer: Self-pay | Admitting: Family Medicine

## 2015-05-18 ENCOUNTER — Other Ambulatory Visit: Payer: Self-pay

## 2015-05-18 DIAGNOSIS — I10 Essential (primary) hypertension: Secondary | ICD-10-CM

## 2015-05-18 MED ORDER — LISINOPRIL-HYDROCHLOROTHIAZIDE 20-25 MG PO TABS: 1.0000 | ORAL_TABLET | Freq: Every day | ORAL | Status: DC

## 2015-07-27 DIAGNOSIS — M25561 Pain in right knee: Secondary | ICD-10-CM | POA: Diagnosis not present

## 2015-08-26 DIAGNOSIS — M25561 Pain in right knee: Secondary | ICD-10-CM | POA: Diagnosis not present

## 2015-09-26 DIAGNOSIS — M25561 Pain in right knee: Secondary | ICD-10-CM | POA: Diagnosis not present

## 2015-10-09 ENCOUNTER — Other Ambulatory Visit: Payer: Self-pay | Admitting: Family Medicine

## 2015-10-26 DIAGNOSIS — M25561 Pain in right knee: Secondary | ICD-10-CM | POA: Diagnosis not present

## 2015-10-31 ENCOUNTER — Other Ambulatory Visit: Payer: Self-pay

## 2015-10-31 DIAGNOSIS — I10 Essential (primary) hypertension: Secondary | ICD-10-CM

## 2015-10-31 MED ORDER — LISINOPRIL-HYDROCHLOROTHIAZIDE 20-25 MG PO TABS: 1.0000 | ORAL_TABLET | Freq: Every day | ORAL | 0 refills | Status: DC

## 2015-11-13 ENCOUNTER — Ambulatory Visit: Payer: BLUE CROSS/BLUE SHIELD | Admitting: Family Medicine

## 2015-11-20 ENCOUNTER — Encounter: Payer: Self-pay | Admitting: Family Medicine

## 2015-11-20 ENCOUNTER — Ambulatory Visit (INDEPENDENT_AMBULATORY_CARE_PROVIDER_SITE_OTHER): Payer: BLUE CROSS/BLUE SHIELD | Admitting: Family Medicine

## 2015-11-20 VITALS — BP 118/70 | HR 80 | Ht 65.0 in | Wt 205.0 lb

## 2015-11-20 DIAGNOSIS — I10 Essential (primary) hypertension: Secondary | ICD-10-CM

## 2015-11-20 MED ORDER — LISINOPRIL-HYDROCHLOROTHIAZIDE 20-25 MG PO TABS: 1.0000 | ORAL_TABLET | Freq: Every day | ORAL | 8 refills | Status: DC

## 2015-11-20 NOTE — Progress Notes (Signed)
Name: Kenneth Simmons   MRN: [id number]    DOB: Aug 01, 1975   Date:11/20/2015       Progress Note  Subjective  Chief Complaint  Chief Complaint  Patient presents with  . Hypertension    Hypertension  This is a chronic problem. The current episode started more than 1 year ago. The problem has been gradually improving since onset. The problem is controlled. Pertinent negatives include no anxiety, blurred vision, chest pain, headaches, malaise/fatigue, neck pain, orthopnea, palpitations, peripheral edema, PND, shortness of breath or sweats. There are no associated agents to hypertension. There are no known risk factors for coronary artery disease. Past treatments include ACE inhibitors and diuretics. The current treatment provides mild improvement. There are no compliance problems.  There is no history of angina, kidney disease, CAD/MI, CVA, heart failure, left ventricular hypertrophy, PVD, renovascular disease or retinopathy. There is no history of chronic renal disease or a hypertension causing med.    No problem-specific Assessment & Plan notes found for this encounter.   Past Medical History:  Diagnosis Date  . Arthritis   . Hypertension     Past Surgical History:  Procedure Laterality Date  . FRACTURE SURGERY     hand   . KNEE ARTHROSCOPY WITH ANTERIOR CRUCIATE LIGAMENT (ACL) REPAIR Left 09/29/2014   Procedure: KNEE ARTHROSCOPY WITH ANTERIOR CRUCIATE LIGAMENT (ACL) REPAIR;  Surgeon: Juanell FairlyKevin Krasinski, MD;  Location: [facility] ORS;  Service: Orthopedics;  Laterality: Left;    History reviewed. No pertinent family history.  Social History   Social History  . Marital status: Married    Spouse name: N/A  . Number of children: N/A  . Years of education: N/A   Occupational History  . Not on file.   Social History Main Topics  . Smoking status: Former Smoker    Packs/day: 1.50    Years: 12.00    Types: Cigarettes  . Smokeless tobacco: Not on file  . Alcohol use Yes     Comment:  occ  . Drug use: No  . Sexual activity: Yes   Other Topics Concern  . Not on file   Social History Narrative  . No narrative on file    No Known Allergies   Review of Systems  Constitutional: Negative for chills, fever, malaise/fatigue and weight loss.  HENT: Negative for ear discharge, ear pain and sore throat.   Eyes: Negative for blurred vision.  Respiratory: Negative for cough, sputum production, shortness of breath and wheezing.   Cardiovascular: Negative for chest pain, palpitations, orthopnea, leg swelling and PND.  Gastrointestinal: Negative for abdominal pain, blood in stool, constipation, diarrhea, heartburn, melena and nausea.  Genitourinary: Negative for dysuria, frequency, hematuria and urgency.  Musculoskeletal: Negative for back pain, joint pain, myalgias and neck pain.  Skin: Negative for rash.  Neurological: Negative for dizziness, tingling, sensory change, focal weakness and headaches.  Endo/Heme/Allergies: Negative for environmental allergies and polydipsia. Does not bruise/bleed easily.  Psychiatric/Behavioral: Negative for depression and suicidal ideas. The patient is not nervous/anxious and does not have insomnia.      Objective  Vitals:   11/20/15 0816  BP: 118/70  Pulse: 80  Weight: 205 lb (93 kg)  Height: 5\' 5"  (1.651 m)    Physical Exam  Constitutional: He is oriented to person, place, and time and well-developed, well-nourished, and in no distress.  HENT:  Head: Normocephalic.  Right Ear: External ear normal.  Left Ear: External ear normal.  Nose: Nose normal.  Mouth/Throat: Oropharynx is clear and  moist.  Eyes: Conjunctivae and EOM are normal. Pupils are equal, round, and reactive to light. Right eye exhibits no discharge. Left eye exhibits no discharge. No scleral icterus.  Neck: Normal range of motion. Neck supple. No JVD present. No tracheal deviation present. No thyromegaly present.  Cardiovascular: Normal rate, regular rhythm, normal  heart sounds and intact distal pulses.  Exam reveals no gallop and no friction rub.   No murmur heard. Pulmonary/Chest: Breath sounds normal. No respiratory distress. He has no wheezes. He has no rales.  Abdominal: Soft. Bowel sounds are normal. He exhibits no mass. There is no hepatosplenomegaly. There is no tenderness. There is no rebound, no guarding and no CVA tenderness.  Musculoskeletal: Normal range of motion. He exhibits no edema or tenderness.  Lymphadenopathy:    He has no cervical adenopathy.  Neurological: He is alert and oriented to person, place, and time. He has normal sensation, normal strength, normal reflexes and intact cranial nerves. No cranial nerve deficit.  Skin: Skin is warm. No rash noted.  Psychiatric: Mood and affect normal.  Nursing note and vitals reviewed.     Assessment & Plan  Problem List Items Addressed This Visit      Cardiovascular and Mediastinum   Essential hypertension - Primary   Relevant Medications   lisinopril-hydrochlorothiazide (PRINZIDE,ZESTORETIC) 20-25 MG tablet   Other Relevant Orders   Renal Function Panel    Other Visit Diagnoses   None.       Dr. Hayden Rasmusseneanna Daphna Lafuente [facility]  11/20/15

## 2016-08-19 ENCOUNTER — Other Ambulatory Visit: Payer: Self-pay

## 2016-08-19 ENCOUNTER — Other Ambulatory Visit: Payer: Self-pay | Admitting: Family Medicine

## 2016-08-19 DIAGNOSIS — I10 Essential (primary) hypertension: Secondary | ICD-10-CM

## 2016-09-05 ENCOUNTER — Ambulatory Visit (INDEPENDENT_AMBULATORY_CARE_PROVIDER_SITE_OTHER): Payer: BLUE CROSS/BLUE SHIELD | Admitting: Family Medicine

## 2016-09-05 ENCOUNTER — Encounter: Payer: Self-pay | Admitting: Family Medicine

## 2016-09-05 DIAGNOSIS — I10 Essential (primary) hypertension: Secondary | ICD-10-CM | POA: Diagnosis not present

## 2016-09-05 MED ORDER — LISINOPRIL-HYDROCHLOROTHIAZIDE 20-25 MG PO TABS: 1.0000 | ORAL_TABLET | Freq: Every day | ORAL | 3 refills | Status: DC

## 2016-09-05 NOTE — Progress Notes (Signed)
Name: Claudean Severanceravis W Donald   MRN: [id number]    DOB: Jun 11, 1975   Date:09/05/2016       Progress Note  Subjective  Chief Complaint  Chief Complaint  Patient presents with  . Hypertension    refill meds    Hypertension  This is a chronic problem. The current episode started more than 1 year ago. The problem has been gradually worsening since onset. The problem is controlled. Pertinent negatives include no anxiety, blurred vision, chest pain, headaches, malaise/fatigue, neck pain, orthopnea, palpitations, peripheral edema, PND, shortness of breath or sweats. There are no associated agents to hypertension. There are no known risk factors for coronary artery disease. Past treatments include ACE inhibitors and diuretics. The current treatment provides moderate improvement. There are no compliance problems.  There is no history of angina, kidney disease, CAD/MI, CVA, heart failure, left ventricular hypertrophy, PVD or retinopathy. There is no history of chronic renal disease, a hypertension causing med or renovascular disease.    No problem-specific Assessment & Plan notes found for this encounter.   Past Medical History:  Diagnosis Date  . Arthritis   . Hypertension     Past Surgical History:  Procedure Laterality Date  . FRACTURE SURGERY     hand   . KNEE ARTHROSCOPY WITH ANTERIOR CRUCIATE LIGAMENT (ACL) REPAIR Left 09/29/2014   Procedure: KNEE ARTHROSCOPY WITH ANTERIOR CRUCIATE LIGAMENT (ACL) REPAIR;  Surgeon: Juanell FairlyKevin Krasinski, MD;  Location: [facility] ORS;  Service: Orthopedics;  Laterality: Left;    No family history on file.  Social History   Social History  . Marital status: Married    Spouse name: N/A  . Number of children: N/A  . Years of education: N/A   Occupational History  . Not on file.   Social History Main Topics  . Smoking status: Former Smoker    Packs/day: 1.50    Years: 12.00    Types: Cigarettes  . Smokeless tobacco: Not on file  . Alcohol use Yes     Comment: occ   . Drug use: No  . Sexual activity: Yes   Other Topics Concern  . Not on file   Social History Narrative  . No narrative on file    No Known Allergies  Outpatient Medications Prior to Visit  Medication Sig Dispense Refill  . Multiple Vitamin (MULTIVITAMIN) capsule Take 1 capsule by mouth daily.    . Omega-3 Fatty Acids (FISH OIL PO) Take 1 capsule by mouth daily.    . vitamin C (ASCORBIC ACID) 500 MG tablet Take 500 mg by mouth daily.    . vitamin E 400 UNIT capsule Take 400 Units by mouth daily.    Marland Kitchen. lisinopril-hydrochlorothiazide (PRINZIDE,ZESTORETIC) 20-25 MG tablet TAKE ONE TABLET BY MOUTH ONCE DAILY 15 tablet 0   No facility-administered medications prior to visit.     Review of Systems  Constitutional: Negative for chills, fever, malaise/fatigue and weight loss.  HENT: Negative for ear discharge, ear pain and sore throat.   Eyes: Negative for blurred vision.  Respiratory: Negative for cough, sputum production, shortness of breath and wheezing.   Cardiovascular: Negative for chest pain, palpitations, orthopnea, leg swelling and PND.  Gastrointestinal: Negative for abdominal pain, blood in stool, constipation, diarrhea, heartburn, melena and nausea.  Genitourinary: Negative for dysuria, frequency, hematuria and urgency.  Musculoskeletal: Negative for back pain, joint pain, myalgias and neck pain.  Skin: Negative for rash.  Neurological: Negative for dizziness, tingling, sensory change, focal weakness and headaches.  Endo/Heme/Allergies: Negative for environmental  allergies and polydipsia. Does not bruise/bleed easily.  Psychiatric/Behavioral: Negative for depression and suicidal ideas. The patient is not nervous/anxious and does not have insomnia.      Objective  Vitals:   09/05/16 1108  BP: 110/70  Pulse: 72  Resp: 12  Weight: 211 lb (95.7 kg)  Height: 5\' 5"  (1.651 m)    Physical Exam  Constitutional: He is oriented to person, place, and time and  well-developed, well-nourished, and in no distress.  HENT:  Head: Normocephalic.  Right Ear: External ear normal.  Left Ear: External ear normal.  Nose: Nose normal.  Mouth/Throat: Oropharynx is clear and moist.  Eyes: Conjunctivae and EOM are normal. Pupils are equal, round, and reactive to light. Right eye exhibits no discharge. Left eye exhibits no discharge. No scleral icterus.  Neck: Normal range of motion. Neck supple. No JVD present. No tracheal deviation present. No thyromegaly present.  Cardiovascular: Normal rate, regular rhythm, normal heart sounds and intact distal pulses.  Exam reveals no gallop and no friction rub.   No murmur heard. Pulmonary/Chest: Breath sounds normal. No respiratory distress. He has no wheezes. He has no rales.  Abdominal: Soft. Bowel sounds are normal. He exhibits no mass. There is no hepatosplenomegaly. There is no tenderness. There is no rebound, no guarding and no CVA tenderness.  Musculoskeletal: Normal range of motion. He exhibits no edema or tenderness.  Lymphadenopathy:    He has no cervical adenopathy.  Neurological: He is alert and oriented to person, place, and time. He has normal sensation, normal strength, normal reflexes and intact cranial nerves. No cranial nerve deficit.  Skin: Skin is warm. No rash noted.  Psychiatric: Mood and affect normal.  Nursing note and vitals reviewed.     Assessment & Plan  Problem List Items Addressed This Visit      Cardiovascular and Mediastinum   Essential hypertension   Relevant Medications   lisinopril-hydrochlorothiazide (PRINZIDE,ZESTORETIC) 20-25 MG tablet      Meds ordered this encounter  Medications  . lisinopril-hydrochlorothiazide (PRINZIDE,ZESTORETIC) 20-25 MG tablet    Sig: Take 1 tablet by mouth daily.    Dispense:  90 tablet    Refill:  3    Please consider 90 day supplies to promote better adherence      Dr. Elizabeth Sauer [facility]  09/05/16
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
## 2017-09-02 ENCOUNTER — Ambulatory Visit: Payer: BLUE CROSS/BLUE SHIELD | Admitting: Family Medicine

## 2017-09-02 ENCOUNTER — Encounter: Payer: Self-pay | Admitting: Family Medicine

## 2017-09-02 VITALS — BP 138/80 | HR 80 | Ht 65.0 in | Wt 212.0 lb

## 2017-09-02 DIAGNOSIS — Z23 Encounter for immunization: Secondary | ICD-10-CM

## 2017-09-02 DIAGNOSIS — E785 Hyperlipidemia, unspecified: Secondary | ICD-10-CM | POA: Diagnosis not present

## 2017-09-02 DIAGNOSIS — I10 Essential (primary) hypertension: Secondary | ICD-10-CM

## 2017-09-02 MED ORDER — LISINOPRIL-HYDROCHLOROTHIAZIDE 20-25 MG PO TABS: 1.0000 | ORAL_TABLET | Freq: Every day | ORAL | 3 refills | Status: DC

## 2017-09-02 NOTE — Assessment & Plan Note (Signed)
Controlled on lisinopril 20-25 mg qday and will check renal panel

## 2017-09-02 NOTE — Progress Notes (Signed)
Name: Kenneth Simmons   MRN: [id number]    DOB: [birth year]   Date:09/02/2017       Progress Note  Subjective  Chief Complaint  Chief Complaint  Patient presents with  . Hypertension  . tetanus vacc need    Hypertension  This is a chronic problem. The current episode started more than 1 year ago. The problem is unchanged. The problem is controlled. Pertinent negatives include no anxiety, blurred vision, chest pain, headaches, malaise/fatigue, neck pain, orthopnea, palpitations, peripheral edema, PND, shortness of breath or sweats. There are no associated agents to hypertension. There are no known risk factors for coronary artery disease. Past treatments include ACE inhibitors and diuretics. The current treatment provides moderate improvement. There are no compliance problems.  There is no history of angina, kidney disease, CAD/MI, CVA, heart failure, left ventricular hypertrophy, PVD or retinopathy. There is no history of chronic renal disease, a hypertension causing med or renovascular disease.  Hyperlipidemia  This is a chronic problem. The current episode started more than 1 year ago. The problem is controlled. Recent lipid tests were reviewed and are normal. He has no history of chronic renal disease. Pertinent negatives include no chest pain, focal weakness, myalgias or shortness of breath. Treatments tried: omega 3. The current treatment provides moderate improvement of lipids. There are no compliance problems.  Risk factors for coronary artery disease include dyslipidemia and hypertension.    Essential hypertension Controlled on lisinopril 20-25 mg qday and will check renal panel   Past Medical History:  Diagnosis Date  . Arthritis   . Hypertension     Past Surgical History:  Procedure Laterality Date  . FRACTURE SURGERY     hand   . KNEE ARTHROSCOPY WITH ANTERIOR CRUCIATE LIGAMENT (ACL) REPAIR Left 09/29/2014   Procedure: KNEE ARTHROSCOPY WITH ANTERIOR CRUCIATE LIGAMENT (ACL)  REPAIR;  Surgeon: Juanell Fairly, MD;  Location: [facility] ORS;  Service: Orthopedics;  Laterality: Left;    No family history on file.  Social History   Socioeconomic History  . Marital status: Married    Spouse name: Not on file  . Number of children: Not on file  . Years of education: Not on file  . Highest education level: Not on file  Occupational History  . Not on file  Social Needs  . Financial resource strain: Not on file  . Food insecurity:    Worry: Not on file    Inability: Not on file  . Transportation needs:    Medical: Not on file    Non-medical: Not on file  Tobacco Use  . Smoking status: Former Smoker    Packs/day: 1.50    Years: 12.00    Pack years: 18.00    Types: Cigarettes  . Smokeless tobacco: Never Used  Substance and Sexual Activity  . Alcohol use: Yes    Comment: occ  . Drug use: No  . Sexual activity: Yes  Lifestyle  . Physical activity:    Days per week: Not on file    Minutes per session: Not on file  . Stress: Not on file  Relationships  . Social connections:    Talks on phone: Not on file    Gets together: Not on file    Attends religious service: Not on file    Active member of club or organization: Not on file    Attends meetings of clubs or organizations: Not on file    Relationship status: Not on file  . Intimate partner violence:  Fear of current or ex partner: Not on file    Emotionally abused: Not on file    Physically abused: Not on file    Forced sexual activity: Not on file  Other Topics Concern  . Not on file  Social History Narrative  . Not on file    No Known Allergies  Outpatient Medications Prior to Visit  Medication Sig Dispense Refill  . Multiple Vitamin (MULTIVITAMIN) capsule Take 1 capsule by mouth daily.    . Omega-3 Fatty Acids (FISH OIL PO) Take 1 capsule by mouth daily.    . vitamin C (ASCORBIC ACID) 500 MG tablet Take 500 mg by mouth daily.    . vitamin E 400 UNIT capsule Take 400 Units by mouth  daily.    Marland Kitchen lisinopril-hydrochlorothiazide (PRINZIDE,ZESTORETIC) 20-25 MG tablet Take 1 tablet by mouth daily. 90 tablet 3   No facility-administered medications prior to visit.     Review of Systems  Constitutional: Negative for chills, fever, malaise/fatigue and weight loss.  HENT: Negative for ear discharge, ear pain and sore throat.   Eyes: Negative for blurred vision.  Respiratory: Negative for cough, sputum production, shortness of breath and wheezing.   Cardiovascular: Negative for chest pain, palpitations, orthopnea, leg swelling and PND.  Gastrointestinal: Negative for abdominal pain, blood in stool, constipation, diarrhea, heartburn, melena and nausea.  Genitourinary: Negative for dysuria, frequency, hematuria and urgency.  Musculoskeletal: Negative for back pain, joint pain, myalgias and neck pain.  Skin: Negative for rash.  Neurological: Negative for dizziness, tingling, sensory change, focal weakness and headaches.  Endo/Heme/Allergies: Negative for environmental allergies and polydipsia. Does not bruise/bleed easily.  Psychiatric/Behavioral: Negative for depression and suicidal ideas. The patient is not nervous/anxious and does not have insomnia.      Objective  Vitals:   09/02/17 0849  BP: 138/80  Pulse: 80  Weight: 212 lb (96.2 kg)  Height: 5\' 5"  (1.651 m)    Physical Exam  Constitutional: He is oriented to person, place, and time.  HENT:  Head: Normocephalic.  Right Ear: External ear normal.  Left Ear: External ear normal.  Nose: Nose normal.  Mouth/Throat: Oropharynx is clear and moist.  Eyes: Pupils are equal, round, and reactive to light. Conjunctivae and EOM are normal. Right eye exhibits no discharge. Left eye exhibits no discharge. No scleral icterus.  Neck: Normal range of motion. Neck supple. No JVD present. No tracheal deviation present. No thyromegaly present.  Cardiovascular: Normal rate, regular rhythm, S1 normal, S2 normal, normal heart sounds  and intact distal pulses. Exam reveals no gallop, no S3, no S4 and no friction rub.  No murmur heard.  No systolic murmur is present.  No diastolic murmur is present. Pulses:      Carotid pulses are 2+ on the right side, and 2+ on the left side.      Radial pulses are 2+ on the right side, and 2+ on the left side.       Femoral pulses are 2+ on the right side, and 2+ on the left side.      Popliteal pulses are 2+ on the right side, and 2+ on the left side.       Dorsalis pedis pulses are 2+ on the right side, and 2+ on the left side.       Posterior tibial pulses are 2+ on the right side, and 2+ on the left side.  Pulmonary/Chest: Breath sounds normal. No respiratory distress. He has no wheezes. He has no rales.  Abdominal: Soft. Bowel sounds are normal. He exhibits no mass. There is no hepatosplenomegaly. There is no tenderness. There is no rebound, no guarding and no CVA tenderness.  Musculoskeletal: Normal range of motion. He exhibits no edema or tenderness.  Lymphadenopathy:    He has no cervical adenopathy.  Neurological: He is alert and oriented to person, place, and time. He has normal strength and normal reflexes. No cranial nerve deficit.  Skin: Skin is warm. No rash noted.  Nursing note and vitals reviewed.     Assessment & Plan  Problem List Items Addressed This Visit      Cardiovascular and Mediastinum   Essential hypertension - Primary    Controlled on lisinopril 20-25 mg qday and will check renal panel      Relevant Medications   lisinopril-hydrochlorothiazide (PRINZIDE,ZESTORETIC) 20-25 MG tablet   Other Relevant Orders   Renal Function Panel    Other Visit Diagnoses    Hyperlipidemia, unspecified hyperlipidemia type       Relevant Medications   lisinopril-hydrochlorothiazide (PRINZIDE,ZESTORETIC) 20-25 MG tablet   Other Relevant Orders   Lipid panel   Need for diphtheria-tetanus-pertussis (Tdap) vaccine       Relevant Orders   Td : Tetanus/diphtheria >7yo  Preservative  free (Completed)      Meds ordered this encounter  Medications  . lisinopril-hydrochlorothiazide (PRINZIDE,ZESTORETIC) 20-25 MG tablet    Sig: Take 1 tablet by mouth daily.    Dispense:  90 tablet    Refill:  3    Please consider 90 day supplies to promote better adherence      Dr. Elizabeth Sauereanna Cyris [facility]  09/02/17

## 2017-09-03 LAB — RENAL FUNCTION PANEL
ALBUMIN: 4.9 g/dL (ref 3.5–5.5)
BUN/Creatinine Ratio: 12 (ref 9–20)
BUN: 11 mg/dL (ref 6–24)
CO2: 23 mmol/L (ref 20–29)
CREATININE: 0.94 mg/dL (ref 0.76–1.27)
Calcium: 9.6 mg/dL (ref 8.7–10.2)
Chloride: 101 mmol/L (ref 96–106)
GFR calc Af Amer: 115 mL/min/{1.73_m2} (ref >59)
GFR calc non Af Amer: 100 mL/min/{1.73_m2} (ref >59)
Glucose: 103 mg/dL — ABNORMAL HIGH (ref 65–99)
Phosphorus: 4 mg/dL (ref 2.5–4.5)
Potassium: 4.8 mmol/L (ref 3.5–5.2)
Sodium: 142 mmol/L (ref 134–144)

## 2017-09-03 LAB — LIPID PANEL
CHOLESTEROL TOTAL: 222 mg/dL — AB (ref 100–199)
Chol/HDL Ratio: 5.4 ratio — ABNORMAL HIGH (ref 0.0–5.0)
HDL: 41 mg/dL (ref >39)
LDL CALC: 109 mg/dL — AB (ref 0–99)
Triglycerides: 359 mg/dL — ABNORMAL HIGH (ref 0–149)
VLDL CHOLESTEROL CAL: 72 mg/dL — AB (ref 5–40)

## 2018-08-07 ENCOUNTER — Ambulatory Visit: Payer: BLUE CROSS/BLUE SHIELD | Admitting: Family Medicine

## 2018-08-07 ENCOUNTER — Other Ambulatory Visit: Payer: Self-pay

## 2018-08-07 ENCOUNTER — Encounter: Payer: Self-pay | Admitting: Family Medicine

## 2018-08-07 VITALS — BP 122/82 | HR 78 | Ht 65.0 in | Wt 217.0 lb

## 2018-08-07 DIAGNOSIS — I10 Essential (primary) hypertension: Secondary | ICD-10-CM

## 2018-08-07 DIAGNOSIS — E785 Hyperlipidemia, unspecified: Secondary | ICD-10-CM | POA: Diagnosis not present

## 2018-08-07 DIAGNOSIS — Z6836 Body mass index (BMI) 36.0-36.9, adult: Secondary | ICD-10-CM

## 2018-08-07 MED ORDER — LISINOPRIL-HYDROCHLOROTHIAZIDE 20-25 MG PO TABS: 1.0000 | ORAL_TABLET | Freq: Every day | ORAL | 1 refills | Status: DC

## 2018-08-07 NOTE — Progress Notes (Signed)
Date:  08/07/2018   Name:  Kenneth Simmons   DOB:  20-Jul-1975   MRN:  [id number]   Chief Complaint: Hypertension and Hyperlipidemia (not on meds- needs recheck)  Hypertension  This is a new problem. The current episode started more than 1 year ago. The problem is unchanged. The problem is controlled. Pertinent negatives include no anxiety, blurred vision, chest pain, headaches, malaise/fatigue, neck pain, orthopnea, palpitations, peripheral edema, PND, shortness of breath or sweats. There are no associated agents to hypertension. Risk factors for coronary artery disease include dyslipidemia, obesity and smoking/tobacco exposure. Past treatments include ACE inhibitors and diuretics. The current treatment provides no improvement. There are no compliance problems.  There is no history of angina, kidney disease, CAD/MI, CVA, heart failure, left ventricular hypertrophy, PVD or retinopathy. There is no history of chronic renal disease, a hypertension causing med or renovascular disease.  Hyperlipidemia  This is a chronic problem. The problem is controlled. Recent lipid tests were reviewed and are normal. He has no history of chronic renal disease. Factors aggravating his hyperlipidemia include thiazides. Pertinent negatives include no chest pain, focal sensory loss, focal weakness, leg pain, myalgias or shortness of breath. The current treatment provides mild improvement of lipids. There are no compliance problems.  Risk factors for coronary artery disease include dyslipidemia and hypertension.    Review of Systems  Constitutional: Negative for chills, fever and malaise/fatigue.  HENT: Negative for drooling, ear discharge, ear pain and sore throat.   Eyes: Negative for blurred vision.  Respiratory: Negative for cough, shortness of breath and wheezing.   Cardiovascular: Negative for chest pain, palpitations, orthopnea, leg swelling and PND.  Gastrointestinal: Negative for abdominal pain, blood in stool,  constipation, diarrhea and nausea.  Endocrine: Negative for polydipsia.  Genitourinary: Negative for dysuria, frequency, hematuria and urgency.  Musculoskeletal: Negative for back pain, myalgias and neck pain.  Skin: Negative for rash.  Allergic/Immunologic: Negative for environmental allergies.  Neurological: Negative for dizziness, focal weakness and headaches.  Hematological: Does not bruise/bleed easily.  Psychiatric/Behavioral: Negative for suicidal ideas. The patient is not nervous/anxious.     Patient Active Problem List   Diagnosis Date Noted  . Essential hypertension 11/20/2015    No Known Allergies  Past Surgical History:  Procedure Laterality Date  . FRACTURE SURGERY     hand   . KNEE ARTHROSCOPY WITH ANTERIOR CRUCIATE LIGAMENT (ACL) REPAIR Left 09/29/2014   Procedure: KNEE ARTHROSCOPY WITH ANTERIOR CRUCIATE LIGAMENT (ACL) REPAIR;  Surgeon: Juanell Fairly, MD;  Location: [facility] ORS;  Service: Orthopedics;  Laterality: Left;    Social History   Tobacco Use  . Smoking status: Former Smoker    Packs/day: 1.50    Years: 12.00    Pack years: 18.00    Types: Cigarettes  . Smokeless tobacco: Never Used  Substance Use Topics  . Alcohol use: Yes    Comment: occ  . Drug use: No     Medication list has been reviewed and updated.  Current Meds  Medication Sig  . lisinopril-hydrochlorothiazide (PRINZIDE,ZESTORETIC) 20-25 MG tablet Take 1 tablet by mouth daily.  . Multiple Vitamin (MULTIVITAMIN) capsule Take 1 capsule by mouth daily.  . Omega-3 Fatty Acids (FISH OIL PO) Take 1 capsule by mouth daily.  . vitamin C (ASCORBIC ACID) 500 MG tablet Take 500 mg by mouth daily.    PHQ 2/9 Scores 08/07/2018 09/02/2017 09/02/2017  PHQ - 2 Score 0 0 0  PHQ- 9 Score 0 0 -    BP  Readings from Last 3 Encounters:  08/07/18 122/82  09/02/17 138/80  09/05/16 110/70    Physical Exam Vitals signs and nursing note reviewed.  Constitutional:      Appearance: He is obese.  HENT:      Head: Normocephalic.     Right Ear: Tympanic membrane, ear canal and external ear normal.     Left Ear: Tympanic membrane, ear canal and external ear normal.     Nose: Nose normal. No congestion or rhinorrhea.     Mouth/Throat:     Mouth: Mucous membranes are moist.  Eyes:     General: No scleral icterus.       Right eye: No discharge.        Left eye: No discharge.     Conjunctiva/sclera: Conjunctivae normal.     Pupils: Pupils are equal, round, and reactive to light.  Neck:     Musculoskeletal: Normal range of motion and neck supple.     Thyroid: No thyromegaly.     Vascular: No JVD.     Trachea: No tracheal deviation.  Cardiovascular:     Rate and Rhythm: Normal rate and regular rhythm.     Pulses: Normal pulses.     Heart sounds: Normal heart sounds. No murmur. No friction rub. No gallop.   Pulmonary:     Effort: Pulmonary effort is normal. No respiratory distress.     Breath sounds: Normal breath sounds. No wheezing, rhonchi or rales.  Chest:     Chest wall: No tenderness.  Abdominal:     General: Abdomen is flat. Bowel sounds are normal. There is no distension.     Palpations: Abdomen is soft. There is no mass.     Tenderness: There is no abdominal tenderness. There is no guarding or rebound.     Hernia: No hernia is present.  Musculoskeletal: Normal range of motion.        General: No tenderness.  Lymphadenopathy:     Cervical: No cervical adenopathy.  Skin:    General: Skin is warm.     Findings: No rash.  Neurological:     Mental Status: He is alert and oriented to person, place, and time.     Cranial Nerves: No cranial nerve deficit.     Deep Tendon Reflexes: Reflexes are normal and symmetric.     Wt Readings from Last 3 Encounters:  08/07/18 217 lb (98.4 kg)  09/02/17 212 lb (96.2 kg)  09/05/16 211 lb (95.7 kg)    BP 122/82   Pulse 78   Ht 5\' 5"  (1.651 m)   Wt 217 lb (98.4 kg)   BMI 36.11 kg/m   Assessment and Plan: 1. Essential hypertension  Chronic.  Controlled.  Continue lisinopril hydrochlorothiazide 20-25 mg.  At this time we will also obtain a renal function panel to assess GFR creatinine. - Renal Function Panel - lisinopril-hydrochlorothiazide (ZESTORETIC) 20-25 MG tablet; Take 1 tablet by mouth daily.  Dispense: 90 tablet; Refill: 1  2. Class 2 severe obesity due to excess calories with serious comorbidity and body mass index (BMI) of 36.0 to 36.9 in adult [facility](HCC) Chronic.  There is been an increase in the patient's weight and BMI.  Patient has been instructed to lose weight with calorie and carb monitoring.Health risks of being over weight were discussed and patient was counseled on weight loss options and exercise. - Renal Function Panel  3. Dyslipidemia Patient has had a mixed hyperlipidemia with triglycerides as well as VLDL and LDL.  Ratio of cholesterol to HDL at 5.4 puts patient at above risk for cardiovascular concerns.  This was discussed with patient and we will check a lipid panel to see if there is a continuance although I rather doubt that he is going to allow [place] to initiate a statin agent. - Lipid Panel With LDL/HDL Ratio

## 2018-08-08 LAB — RENAL FUNCTION PANEL
Albumin: 5.2 g/dL — ABNORMAL HIGH (ref 4.0–5.0)
BUN/Creatinine Ratio: 16 (ref 9–20)
BUN: 16 mg/dL (ref 6–24)
CO2: 25 mmol/L (ref 20–29)
Calcium: 10.3 mg/dL — ABNORMAL HIGH (ref 8.7–10.2)
Chloride: 93 mmol/L — ABNORMAL LOW (ref 96–106)
Creatinine, Ser: 1.01 mg/dL (ref 0.76–1.27)
GFR calc Af Amer: 105 mL/min/{1.73_m2} (ref >59)
GFR calc non Af Amer: 91 mL/min/{1.73_m2} (ref >59)
Glucose: 113 mg/dL — ABNORMAL HIGH (ref 65–99)
Phosphorus: 3.5 mg/dL (ref 2.8–4.1)
Potassium: 4.9 mmol/L (ref 3.5–5.2)
Sodium: 134 mmol/L (ref 134–144)

## 2018-08-08 LAB — LIPID PANEL WITH LDL/HDL RATIO
Cholesterol, Total: 241 mg/dL — ABNORMAL HIGH (ref 100–199)
HDL: 41 mg/dL (ref >39)
LDL Calculated: 141 mg/dL — ABNORMAL HIGH (ref 0–99)
LDl/HDL Ratio: 3.4 ratio (ref 0.0–3.6)
Triglycerides: 296 mg/dL — ABNORMAL HIGH (ref 0–149)
VLDL Cholesterol Cal: 59 mg/dL — ABNORMAL HIGH (ref 5–40)

## 2018-10-06 DIAGNOSIS — Z20828 Contact with and (suspected) exposure to other viral communicable diseases: Secondary | ICD-10-CM | POA: Diagnosis not present

## 2018-12-15 ENCOUNTER — Other Ambulatory Visit: Payer: BC Managed Care – PPO

## 2018-12-15 DIAGNOSIS — E785 Hyperlipidemia, unspecified: Secondary | ICD-10-CM

## 2018-12-15 DIAGNOSIS — R221 Localized swelling, mass and lump, neck: Secondary | ICD-10-CM | POA: Diagnosis not present

## 2018-12-15 NOTE — Progress Notes (Signed)
Printed lab sheet

## 2018-12-16 DIAGNOSIS — E785 Hyperlipidemia, unspecified: Secondary | ICD-10-CM | POA: Diagnosis not present

## 2018-12-17 LAB — LIPID PANEL WITH LDL/HDL RATIO
Cholesterol, Total: 139 mg/dL (ref 100–199)
HDL: 54 mg/dL (ref >39)
LDL Chol Calc (NIH): 71 mg/dL (ref 0–99)
LDL/HDL Ratio: 1.3 ratio (ref 0.0–3.6)
Triglycerides: 68 mg/dL (ref 0–149)
VLDL Cholesterol Cal: 14 mg/dL (ref 5–40)

## 2018-12-22 DIAGNOSIS — R221 Localized swelling, mass and lump, neck: Secondary | ICD-10-CM | POA: Diagnosis not present

## 2019-01-21 DIAGNOSIS — M542 Cervicalgia: Secondary | ICD-10-CM | POA: Diagnosis not present

## 2019-02-01 DIAGNOSIS — M25512 Pain in left shoulder: Secondary | ICD-10-CM | POA: Diagnosis not present

## 2019-02-01 DIAGNOSIS — M542 Cervicalgia: Secondary | ICD-10-CM | POA: Diagnosis not present

## 2019-02-09 DIAGNOSIS — M542 Cervicalgia: Secondary | ICD-10-CM | POA: Diagnosis not present

## 2019-02-17 DIAGNOSIS — M542 Cervicalgia: Secondary | ICD-10-CM | POA: Diagnosis not present

## 2019-02-23 ENCOUNTER — Other Ambulatory Visit: Payer: Self-pay

## 2019-02-23 ENCOUNTER — Ambulatory Visit: Payer: BC Managed Care – PPO | Admitting: Family Medicine

## 2019-02-23 ENCOUNTER — Encounter: Payer: Self-pay | Admitting: Family Medicine

## 2019-02-23 VITALS — BP 120/64 | HR 80 | Ht 65.0 in | Wt 182.0 lb

## 2019-02-23 DIAGNOSIS — I10 Essential (primary) hypertension: Secondary | ICD-10-CM | POA: Diagnosis not present

## 2019-02-23 DIAGNOSIS — R5383 Other fatigue: Secondary | ICD-10-CM | POA: Diagnosis not present

## 2019-02-23 DIAGNOSIS — E785 Hyperlipidemia, unspecified: Secondary | ICD-10-CM

## 2019-02-23 NOTE — Progress Notes (Signed)
Date:  02/23/2019   Name:  Kenneth Simmons   DOB:  1975/10/01   MRN:  [id number]   Chief Complaint: Hypertension  Hypertension This is a chronic problem. The current episode started more than 1 year ago. The problem has been gradually improving since onset. The problem is controlled. Pertinent negatives include no anxiety, blurred vision, chest pain, headaches, malaise/fatigue, neck pain, orthopnea, palpitations, peripheral edema, PND, shortness of breath or sweats. There are no associated agents to hypertension. Risk factors for coronary artery disease include diabetes mellitus and dyslipidemia. Past treatments include ACE inhibitors and diuretics. The current treatment provides moderate improvement. There are no compliance problems.  There is no history of angina, kidney disease, CAD/MI, CVA, heart failure, left ventricular hypertrophy, PVD or retinopathy. Identifiable causes of hypertension include a thyroid problem. There is no history of chronic renal disease, a hypertension causing med or renovascular disease.  Thyroid Problem Presents for follow-up visit. Symptoms include fatigue, heat intolerance and weight loss. Patient reports no anxiety, cold intolerance, constipation, depressed mood, diaphoresis, diarrhea, dry skin, hair loss, hoarse voice, leg swelling, nail problem, palpitations, tremors, visual change or weight gain. The symptoms have been worsening. There is no history of heart failure.    Lab Results  Component Value Date   CREATININE 1.01 08/07/2018   BUN 16 08/07/2018   NA 134 08/07/2018   K 4.9 08/07/2018   CL 93 (L) 08/07/2018   CO2 25 08/07/2018   Lab Results  Component Value Date   CHOL 139 12/16/2018   HDL 54 12/16/2018   LDLCALC 71 12/16/2018   TRIG 68 12/16/2018   CHOLHDL 5.4 (H) 09/02/2017   No results found for: TSH No results found for: HGBA1C   Review of Systems  Constitutional: Positive for fatigue and weight loss. Negative for chills, diaphoresis,  fever, malaise/fatigue and weight gain.  HENT: Negative for drooling, ear discharge, ear pain, hoarse voice and sore throat.   Eyes: Negative for blurred vision.  Respiratory: Negative for cough, shortness of breath and wheezing.   Cardiovascular: Negative for chest pain, palpitations, orthopnea, leg swelling and PND.  Gastrointestinal: Negative for abdominal pain, blood in stool, constipation, diarrhea and nausea.  Endocrine: Positive for heat intolerance. Negative for cold intolerance and polydipsia.  Genitourinary: Negative for dysuria, frequency, hematuria and urgency.  Musculoskeletal: Negative for back pain, myalgias and neck pain.  Skin: Negative for rash.  Allergic/Immunologic: Negative for environmental allergies.  Neurological: Negative for dizziness, tremors and headaches.  Hematological: Does not bruise/bleed easily.  Psychiatric/Behavioral: Negative for suicidal ideas. The patient is not nervous/anxious.     Patient Active Problem List   Diagnosis Date Noted  . Essential hypertension 11/20/2015    No Known Allergies  Past Surgical History:  Procedure Laterality Date  . FRACTURE SURGERY     hand   . KNEE ARTHROSCOPY WITH ANTERIOR CRUCIATE LIGAMENT (ACL) REPAIR Left 09/29/2014   Procedure: KNEE ARTHROSCOPY WITH ANTERIOR CRUCIATE LIGAMENT (ACL) REPAIR;  Surgeon: Juanell Fairly, MD;  Location: [facility] ORS;  Service: Orthopedics;  Laterality: Left;    Social History   Tobacco Use  . Smoking status: Former Smoker    Packs/day: 1.50    Years: 12.00    Pack years: 18.00    Types: Cigarettes  . Smokeless tobacco: Never Used  Substance Use Topics  . Alcohol use: Yes    Comment: occ  . Drug use: No     Medication list has been reviewed and updated.  Current Meds  Medication Sig  . lisinopril-hydrochlorothiazide (ZESTORETIC) 20-25 MG tablet Take 1 tablet by mouth daily.  . Multiple Vitamin (MULTIVITAMIN) capsule Take 1 capsule by mouth daily.  . Omega-3 Fatty Acids  (FISH OIL PO) Take 1 capsule by mouth daily.  . vitamin C (ASCORBIC ACID) 500 MG tablet Take 500 mg by mouth daily.    PHQ 2/9 Scores 02/23/2019 08/07/2018 09/02/2017 09/02/2017  PHQ - 2 Score 0 0 0 0  PHQ- 9 Score 3 0 0 -    BP Readings from Last 3 Encounters:  02/23/19 120/64  08/07/18 122/82  09/02/17 138/80    Physical Exam Vitals signs and nursing note reviewed.  HENT:     Head: Normocephalic.     Right Ear: Tympanic membrane, ear canal and external ear normal.     Left Ear: Tympanic membrane, ear canal and external ear normal.     Nose: Nose normal.  Eyes:     General: No scleral icterus.       Right eye: No discharge.        Left eye: No discharge.     Conjunctiva/sclera: Conjunctivae normal.     Pupils: Pupils are equal, round, and reactive to light.  Neck:     Musculoskeletal: Normal range of motion and neck supple.     Thyroid: No thyromegaly.     Vascular: No JVD.     Trachea: No tracheal deviation.  Cardiovascular:     Rate and Rhythm: Normal rate and regular rhythm.     Heart sounds: Normal heart sounds. No murmur. No friction rub. No gallop.   Pulmonary:     Effort: No respiratory distress.     Breath sounds: Normal breath sounds. No wheezing or rales.  Abdominal:     General: Bowel sounds are normal.     Palpations: Abdomen is soft. There is no mass.     Tenderness: There is no abdominal tenderness. There is no guarding or rebound.  Musculoskeletal: Normal range of motion.        General: No tenderness.  Lymphadenopathy:     Cervical: No cervical adenopathy.  Skin:    General: Skin is warm.     Findings: No rash.  Neurological:     Mental Status: He is alert and oriented to person, place, and time.     Cranial Nerves: No cranial nerve deficit.     Deep Tendon Reflexes: Reflexes are normal and symmetric.     Wt Readings from Last 3 Encounters:  02/23/19 182 lb (82.6 kg)  08/07/18 217 lb (98.4 kg)  09/02/17 212 lb (96.2 kg)    BP 120/64   Pulse  80   Ht 5\' 5"  (1.651 m)   Wt 182 lb (82.6 kg)   BMI 30.29 kg/m   Assessment and Plan: 1. Essential hypertension Chronic.  Controlled.  Stable.  Patient's has had a significant weight loss due to an aggressive exercise routine 12 miles a day weight lifting and dieting.  Patient has lost about 40 pounds.  Blood pressure is in a remarkable range at this time so we will discontinue his Zestoretic and will recheck in 6 weeks in the meantime patient will be watching out for his sodium intake. - Renal Function Panel  2. Fatigue, unspecified type New onset.  Persistent.  Despite all of this though patient has developed some fatigue this is of some concern.  We will check a TSH and a testosterone free and total level. - TSH - Testosterone,Free and Total  3. Hyperlipidemia, unspecified hyperlipidemia type Patient has had excellent hyperlipidemia on current regimen patient will continue omega-3 but will continue his current dietary regimen.  We will not check lipid panel at this time.

## 2019-02-24 LAB — RENAL FUNCTION PANEL
Albumin: 4.7 g/dL (ref 4.0–5.0)
BUN/Creatinine Ratio: 15 (ref 9–20)
BUN: 15 mg/dL (ref 6–24)
CO2: 26 mmol/L (ref 20–29)
Calcium: 9.9 mg/dL (ref 8.7–10.2)
Chloride: 100 mmol/L (ref 96–106)
Creatinine, Ser: 1.03 mg/dL (ref 0.76–1.27)
GFR calc Af Amer: 102 mL/min/{1.73_m2} (ref >59)
GFR calc non Af Amer: 89 mL/min/{1.73_m2} (ref >59)
Glucose: 97 mg/dL (ref 65–99)
Phosphorus: 4.5 mg/dL — ABNORMAL HIGH (ref 2.8–4.1)
Potassium: 5.5 mmol/L — ABNORMAL HIGH (ref 3.5–5.2)
Sodium: 140 mmol/L (ref 134–144)

## 2019-02-24 LAB — TSH: TSH: 0.701 u[IU]/mL (ref 0.450–4.500)

## 2019-02-24 LAB — TESTOSTERONE,FREE AND TOTAL
Testosterone, Free: 10.1 pg/mL (ref 6.8–21.5)
Testosterone: 604 ng/dL (ref 264–916)

## 2019-06-14 DIAGNOSIS — M25512 Pain in left shoulder: Secondary | ICD-10-CM | POA: Diagnosis not present

## 2019-06-23 ENCOUNTER — Encounter: Payer: Self-pay | Admitting: Family Medicine

## 2019-06-23 ENCOUNTER — Other Ambulatory Visit: Payer: Self-pay

## 2019-06-23 ENCOUNTER — Ambulatory Visit: Payer: BC Managed Care – PPO | Admitting: Family Medicine

## 2019-06-23 VITALS — BP 128/80 | HR 72 | Ht 65.0 in | Wt 184.0 lb

## 2019-06-23 DIAGNOSIS — R030 Elevated blood-pressure reading, without diagnosis of hypertension: Secondary | ICD-10-CM

## 2019-06-23 NOTE — Patient Instructions (Signed)
DASH Eating Plan DASH stands for "Dietary Approaches to Stop Hypertension." The DASH eating plan is a healthy eating plan that has been shown to reduce high blood pressure (hypertension). It may also reduce your risk for type 2 diabetes, heart disease, and stroke. The DASH eating plan may also help with weight loss. What are tips for following this plan?  General guidelines  Avoid eating more than 2,300 mg (milligrams) of salt (sodium) a day. If you have hypertension, you may need to reduce your sodium intake to 1,500 mg a day.  Limit alcohol intake to no more than 1 drink a day for nonpregnant women and 2 drinks a day for men. One drink equals 12 oz of beer, 5 oz of wine, or 1 oz of hard liquor.  Work with your health care provider to maintain a healthy body weight or to lose weight. Ask what an ideal weight is for you.  Get at least 30 minutes of exercise that causes your heart to beat faster (aerobic exercise) most days of the week. Activities may include walking, swimming, or biking.  Work with your health care provider or diet and nutrition specialist (dietitian) to adjust your eating plan to your individual calorie needs. Reading food labels   Check food labels for the amount of sodium per serving. Choose foods with less than 5 percent of the Daily Value of sodium. Generally, foods with less than 300 mg of sodium per serving fit into this eating plan.  To find whole grains, look for the word "whole" as the first word in the ingredient list. Shopping  Buy products labeled as "low-sodium" or "no salt added."  Buy fresh foods. Avoid canned foods and premade or frozen meals. Cooking  Avoid adding salt when cooking. Use salt-free seasonings or herbs instead of table salt or sea salt. Check with your health care provider or pharmacist before using salt substitutes.  Do not fry foods. Cook foods using healthy methods such as baking, boiling, grilling, and broiling instead.  Cook with  heart-healthy oils, such as olive, canola, soybean, or sunflower oil. Meal planning  Eat a balanced diet that includes: ? 5 or more servings of fruits and vegetables each day. At each meal, try to fill half of your plate with fruits and vegetables. ? Up to 6-8 servings of whole grains each day. ? Less than 6 oz of lean meat, poultry, or fish each day. A 3-oz serving of meat is about the same size as a deck of cards. One egg equals 1 oz. ? 2 servings of low-fat dairy each day. ? A serving of nuts, seeds, or beans 5 times each week. ? Heart-healthy fats. Healthy fats called Omega-3 fatty acids are found in foods such as flaxseeds and coldwater fish, like sardines, salmon, and mackerel.  Limit how much you eat of the following: ? Canned or prepackaged foods. ? Food that is high in trans fat, such as fried foods. ? Food that is high in saturated fat, such as fatty meat. ? Sweets, desserts, sugary drinks, and other foods with added sugar. ? Full-fat dairy products.  Do not salt foods before eating.  Try to eat at least 2 vegetarian meals each week.  Eat more home-cooked food and less restaurant, buffet, and fast food.  When eating at a restaurant, ask that your food be prepared with less salt or no salt, if possible. What foods are recommended? The items listed may not be a complete list. Talk with your dietitian about   what dietary choices are best for you. Grains Whole-grain or whole-wheat bread. Whole-grain or whole-wheat pasta. Brown rice. Oatmeal. Quinoa. Bulgur. Whole-grain and low-sodium cereals. Pita bread. Low-fat, low-sodium crackers. Whole-wheat flour tortillas. Vegetables Fresh or frozen vegetables (raw, steamed, roasted, or grilled). Low-sodium or reduced-sodium tomato and vegetable juice. Low-sodium or reduced-sodium tomato sauce and tomato paste. Low-sodium or reduced-sodium canned vegetables. Fruits All fresh, dried, or frozen fruit. Canned fruit in natural juice (without  added sugar). Meat and other protein foods Skinless chicken or turkey. Ground chicken or turkey. Pork with fat trimmed off. Fish and seafood. Egg whites. Dried beans, peas, or lentils. Unsalted nuts, nut butters, and seeds. Unsalted canned beans. Lean cuts of beef with fat trimmed off. Low-sodium, lean deli meat. Dairy Low-fat (1%) or fat-free (skim) milk. Fat-free, low-fat, or reduced-fat cheeses. Nonfat, low-sodium ricotta or cottage cheese. Low-fat or nonfat yogurt. Low-fat, low-sodium cheese. Fats and oils Soft margarine without trans fats. Vegetable oil. Low-fat, reduced-fat, or light mayonnaise and salad dressings (reduced-sodium). Canola, safflower, olive, soybean, and sunflower oils. Avocado. Seasoning and other foods Herbs. Spices. Seasoning mixes without salt. Unsalted popcorn and pretzels. Fat-free sweets. What foods are not recommended? The items listed may not be a complete list. Talk with your dietitian about what dietary choices are best for you. Grains Baked goods made with fat, such as croissants, muffins, or some breads. Dry pasta or rice meal packs. Vegetables Creamed or fried vegetables. Vegetables in a cheese sauce. Regular canned vegetables (not low-sodium or reduced-sodium). Regular canned tomato sauce and paste (not low-sodium or reduced-sodium). Regular tomato and vegetable juice (not low-sodium or reduced-sodium). Pickles. Olives. Fruits Canned fruit in a light or heavy syrup. Fried fruit. Fruit in cream or butter sauce. Meat and other protein foods Fatty cuts of meat. Ribs. Fried meat. Bacon. Sausage. Bologna and other processed lunch meats. Salami. Fatback. Hotdogs. Bratwurst. Salted nuts and seeds. Canned beans with added salt. Canned or smoked fish. Whole eggs or egg yolks. Chicken or turkey with skin. Dairy Whole or 2% milk, cream, and half-and-half. Whole or full-fat cream cheese. Whole-fat or sweetened yogurt. Full-fat cheese. Nondairy creamers. Whipped toppings.  Processed cheese and cheese spreads. Fats and oils Butter. Stick margarine. Lard. Shortening. Ghee. Bacon fat. Tropical oils, such as coconut, palm kernel, or palm oil. Seasoning and other foods Salted popcorn and pretzels. Onion salt, garlic salt, seasoned salt, table salt, and sea salt. Worcestershire sauce. Tartar sauce. Barbecue sauce. Teriyaki sauce. Soy sauce, including reduced-sodium. Steak sauce. Canned and packaged gravies. Fish sauce. Oyster sauce. Cocktail sauce. Horseradish that you find on the shelf. Ketchup. Mustard. Meat flavorings and tenderizers. Bouillon cubes. Hot sauce and Tabasco sauce. Premade or packaged marinades. Premade or packaged taco seasonings. Relishes. Regular salad dressings. Where to find more information:  National Heart, Lung, and Blood Institute: www.nhlbi.nih.gov  American Heart Association: www.heart.org Summary  The DASH eating plan is a healthy eating plan that has been shown to reduce high blood pressure (hypertension). It may also reduce your risk for type 2 diabetes, heart disease, and stroke.  With the DASH eating plan, you should limit salt (sodium) intake to 2,300 mg a day. If you have hypertension, you may need to reduce your sodium intake to 1,500 mg a day.  When on the DASH eating plan, aim to eat more fresh fruits and vegetables, whole grains, lean proteins, low-fat dairy, and heart-healthy fats.  Work with your health care provider or diet and nutrition specialist (dietitian) to adjust your eating plan to your   individual calorie needs. This information is not intended to replace advice given to you by your health care provider. Make sure you discuss any questions you have with your health care provider. Document Revised: 02/28/2017 Document Reviewed: 03/11/2016 Elsevier Patient Education  2020 Elsevier Inc.   How to Take Your Blood Pressure You can take your blood pressure at home with a machine. You may need to check your blood pressure  at home:  To check if you have high blood pressure (hypertension).  To check your blood pressure over time.  To make sure your blood pressure medicine is working. Supplies needed: You will need a blood pressure machine, or monitor. You can buy one at a drugstore or online. When choosing one:  Choose one with an arm cuff.  Choose one that wraps around your upper arm. Only one finger should fit between your arm and the cuff.  Do not choose one that measures your blood pressure from your wrist or finger. Your doctor can suggest a monitor. How to prepare Avoid these things for 30 minutes before checking your blood pressure:  Drinking caffeine.  Drinking alcohol.  Eating.  Smoking.  Exercising. Five minutes before checking your blood pressure:  Pee.  Sit in a dining chair. Avoid sitting in a soft couch or armchair.  Be quiet. Do not talk. How to take your blood pressure Follow the instructions that came with your machine. If you have a digital blood pressure monitor, these may be the instructions: 1. Sit up straight. 2. Place your feet on the floor. Do not cross your ankles or legs. 3. Rest your left arm at the level of your heart. You may rest it on a table, desk, or chair. 4. Pull up your shirt sleeve. 5. Wrap the blood pressure cuff around the upper part of your left arm. The cuff should be 1 inch (2.5 cm) above your elbow. It is best to wrap the cuff around bare skin. 6. Fit the cuff snugly around your arm. You should be able to place only one finger between the cuff and your arm. 7. Put the cord inside the groove of your elbow. 8. Press the power button. 9. Sit quietly while the cuff fills with air and loses air. 10. Write down the numbers on the screen. 11. Wait 2-3 minutes and then repeat steps 1-10. What do the numbers mean? Two numbers make up your blood pressure. The first number is called systolic pressure. The second is called diastolic pressure. An example of  a blood pressure reading is "120 over 80" (or 120/80). If you are an adult and do not have a medical condition, use this guide to find out if your blood pressure is normal: Normal  First number: below 120.  Second number: below 80. Elevated  First number: 120-129.  Second number: below 80. Hypertension stage 1  First number: 130-139.  Second number: 80-89. Hypertension stage 2  First number: 140 or above.  Second number: 90 or above. Your blood pressure is above normal even if only the top or bottom number is above normal. Follow these instructions at home:  Check your blood pressure as often as your doctor tells you to.  Take your monitor to your next doctor's appointment. Your doctor will: ? Make sure you are using it correctly. ? Make sure it is working right.  Make sure you understand what your blood pressure numbers should be.  Tell your doctor if your medicines are causing side effects. Contact a   doctor if:  Your blood pressure keeps being high. Get help right away if:  Your first blood pressure number is higher than 180.  Your second blood pressure number is higher than 120. This information is not intended to replace advice given to you by your health care provider. Make sure you discuss any questions you have with your health care provider. Document Revised: 02/28/2017 Document Reviewed: 08/25/2015 Elsevier Patient Education  2020 Elsevier Inc.   

## 2019-06-23 NOTE — Progress Notes (Signed)
Date:  06/23/2019   Name:  Kenneth Simmons   DOB:  [birth year]   MRN:  [id number]   Chief Complaint: Follow-up (recheck on b/p off med)  Hypertension This is a chronic problem. The current episode started more than 1 year ago. The problem has been gradually improving since onset. The problem is controlled. Pertinent negatives include no anxiety, blurred vision, chest pain, headaches, malaise/fatigue, neck pain, orthopnea, palpitations, peripheral edema, PND, shortness of breath or sweats. There are no associated agents to hypertension. There are no known risk factors for coronary artery disease. Treatments tried: currently sodium restriction. The current treatment provides moderate improvement. There are no compliance problems.  There is no history of angina, kidney disease, CAD/MI, CVA, heart failure, left ventricular hypertrophy, PVD or retinopathy. There is no history of chronic renal disease, a hypertension causing med or renovascular disease.    Lab Results  Component Value Date   CREATININE 1.03 02/23/2019   BUN 15 02/23/2019   NA 140 02/23/2019   K 5.5 (H) 02/23/2019   CL 100 02/23/2019   CO2 26 02/23/2019   Lab Results  Component Value Date   CHOL 139 12/16/2018   HDL 54 12/16/2018   LDLCALC 71 12/16/2018   TRIG 68 12/16/2018   CHOLHDL 5.4 (H) 09/02/2017   Lab Results  Component Value Date   TSH 0.701 02/23/2019   No results found for: HGBA1C Lab Results  Component Value Date   WBC 6.5 09/28/2014   HGB 14.5 09/28/2014   HCT 43.3 09/28/2014   MCV 92.9 09/28/2014   PLT 181 09/28/2014   No results found for: ALT, AST, GGT, ALKPHOS, BILITOT   Review of Systems  Constitutional: Negative for chills, fever and malaise/fatigue.  HENT: Negative for drooling, ear discharge, ear pain and sore throat.   Eyes: Negative for blurred vision.  Respiratory: Negative for cough, shortness of breath and wheezing.   Cardiovascular: Negative for chest pain, palpitations, orthopnea,  leg swelling and PND.  Gastrointestinal: Negative for abdominal pain, blood in stool, constipation, diarrhea and nausea.  Endocrine: Negative for polydipsia.  Genitourinary: Negative for dysuria, frequency, hematuria and urgency.  Musculoskeletal: Negative for back pain, myalgias and neck pain.  Skin: Negative for rash.  Allergic/Immunologic: Negative for environmental allergies.  Neurological: Negative for dizziness and headaches.  Hematological: Does not bruise/bleed easily.  Psychiatric/Behavioral: Negative for suicidal ideas. The patient is not nervous/anxious.     Patient Active Problem List   Diagnosis Date Noted  . Essential hypertension 11/20/2015    No Known Allergies  Past Surgical History:  Procedure Laterality Date  . FRACTURE SURGERY     hand   . KNEE ARTHROSCOPY WITH ANTERIOR CRUCIATE LIGAMENT (ACL) REPAIR Left 09/29/2014   Procedure: KNEE ARTHROSCOPY WITH ANTERIOR CRUCIATE LIGAMENT (ACL) REPAIR;  Surgeon: Juanell Fairly, MD;  Location: [facility] ORS;  Service: Orthopedics;  Laterality: Left;    Social History   Tobacco Use  . Smoking status: Former Smoker    Packs/day: 1.50    Years: 12.00    Pack years: 18.00    Types: Cigarettes  . Smokeless tobacco: Never Used  Substance Use Topics  . Alcohol use: Yes    Comment: occ  . Drug use: No     Medication list has been reviewed and updated.  Current Meds  Medication Sig  . Multiple Vitamin (MULTIVITAMIN) capsule Take 1 capsule by mouth daily.  . Omega-3 Fatty Acids (FISH OIL PO) Take 1 capsule by mouth daily.  Marland Kitchen  vitamin C (ASCORBIC ACID) 500 MG tablet Take 500 mg by mouth daily.    PHQ 2/9 Scores 06/23/2019 02/23/2019 08/07/2018 09/02/2017  PHQ - 2 Score 0 0 0 0  PHQ- 9 Score 2 3 0 0    BP Readings from Last 3 Encounters:  06/23/19 128/80  02/23/19 120/64  08/07/18 122/82    Physical Exam Vitals and nursing note reviewed.  HENT:     Head: Normocephalic.     Right Ear: Tympanic membrane and external  ear normal.     Left Ear: Tympanic membrane and external ear normal.     Nose: Nose normal.  Eyes:     General: No scleral icterus.       Right eye: No discharge.        Left eye: No discharge.     Conjunctiva/sclera: Conjunctivae normal.     Pupils: Pupils are equal, round, and reactive to light.  Neck:     Thyroid: No thyromegaly.     Vascular: No JVD.     Trachea: No tracheal deviation.  Cardiovascular:     Rate and Rhythm: Normal rate and regular rhythm.     Heart sounds: Normal heart sounds, S1 normal and S2 normal. No murmur. No systolic murmur. No diastolic murmur. No friction rub. No gallop. No S3 or S4 sounds.   Pulmonary:     Effort: No respiratory distress.     Breath sounds: Normal breath sounds. No wheezing or rales.  Abdominal:     General: Bowel sounds are normal.     Palpations: Abdomen is soft. There is no mass.     Tenderness: There is no abdominal tenderness. There is no guarding or rebound.  Musculoskeletal:        General: No tenderness. Normal range of motion.     Cervical back: Normal range of motion and neck supple.  Lymphadenopathy:     Cervical: No cervical adenopathy.  Skin:    General: Skin is warm.     Findings: No rash.  Neurological:     Mental Status: He is alert and oriented to person, place, and time.     Cranial Nerves: No cranial nerve deficit.     Deep Tendon Reflexes: Reflexes are normal and symmetric.     Wt Readings from Last 3 Encounters:  06/23/19 184 lb (83.5 kg)  02/23/19 182 lb (82.6 kg)  08/07/18 217 lb (98.4 kg)    BP 128/80   Pulse 72   Ht 5\' 5"  (1.651 m)   Wt 184 lb (83.5 kg)   BMI 30.62 kg/m   Assessment and Plan: 1. Elevated blood pressure reading without diagnosis of hypertension Chronic.  Episodic.  Stable.  Controlled.  Has had a significant weight loss by design and continues to workout.  Patient is not overweight but is muscular and is not considered to be in the obesity range as BMI may indicate.  Since  discontinuance of the ACE and hydrochlorothiazide combination patient's blood pressure remains in acceptable range.  We will continue with DASH diet for further control and patient is continuing to lose weight with his current regimen.  We will recheck patient for physical later in the year and patient will continue to diet and exercise.

## 2019-06-29 DIAGNOSIS — R399 Unspecified symptoms and signs involving the genitourinary system: Secondary | ICD-10-CM | POA: Diagnosis not present

## 2019-06-29 DIAGNOSIS — R300 Dysuria: Secondary | ICD-10-CM | POA: Diagnosis not present

## 2019-06-29 DIAGNOSIS — R310 Gross hematuria: Secondary | ICD-10-CM | POA: Diagnosis not present

## 2019-06-30 ENCOUNTER — Ambulatory Visit: Payer: BC Managed Care – PPO | Admitting: Family Medicine

## 2019-07-05 DIAGNOSIS — M25512 Pain in left shoulder: Secondary | ICD-10-CM | POA: Diagnosis not present

## 2019-07-08 DIAGNOSIS — M25512 Pain in left shoulder: Secondary | ICD-10-CM | POA: Diagnosis not present

## 2019-07-15 DIAGNOSIS — M25512 Pain in left shoulder: Secondary | ICD-10-CM | POA: Diagnosis not present

## 2019-11-17 DIAGNOSIS — Z20828 Contact with and (suspected) exposure to other viral communicable diseases: Secondary | ICD-10-CM | POA: Diagnosis not present

## 2020-02-21 DIAGNOSIS — D225 Melanocytic nevi of trunk: Secondary | ICD-10-CM | POA: Diagnosis not present

## 2020-02-21 DIAGNOSIS — L570 Actinic keratosis: Secondary | ICD-10-CM | POA: Diagnosis not present

## 2020-04-04 DIAGNOSIS — R509 Fever, unspecified: Secondary | ICD-10-CM | POA: Diagnosis not present

## 2020-04-04 DIAGNOSIS — Z03818 Encounter for observation for suspected exposure to other biological agents ruled out: Secondary | ICD-10-CM | POA: Diagnosis not present

## 2020-04-04 DIAGNOSIS — U071 COVID-19: Secondary | ICD-10-CM | POA: Diagnosis not present

## 2020-04-10 ENCOUNTER — Other Ambulatory Visit: Payer: Self-pay

## 2020-04-10 ENCOUNTER — Ambulatory Visit (INDEPENDENT_AMBULATORY_CARE_PROVIDER_SITE_OTHER): Payer: BC Managed Care – PPO | Admitting: Family Medicine

## 2020-04-10 ENCOUNTER — Encounter: Payer: Self-pay | Admitting: Family Medicine

## 2020-04-10 ENCOUNTER — Telehealth: Payer: Self-pay

## 2020-04-10 VITALS — Ht 65.0 in | Wt 184.0 lb

## 2020-04-10 DIAGNOSIS — J0100 Acute maxillary sinusitis, unspecified: Secondary | ICD-10-CM

## 2020-04-10 MED ORDER — AMOXICILLIN 500 MG PO CAPS: 500.0000 mg | ORAL_CAPSULE | Freq: Three times a day (TID) | ORAL | 0 refills | Status: DC

## 2020-04-10 NOTE — Progress Notes (Addendum)
Date:  04/10/2020   Name:  Kenneth Simmons   DOB:  May 09, 1975   MRN:  [id number]   Chief Complaint: Sinusitis (Sinus pressure around eyes and on face. Pressure in ears, drainage, slight cough. Dx with COVID on 04/05/2020)  I connected withthis patient, Kenneth Simmons, by telephoneat the patient's home.  I verified that I am speaking with the correct person using two identifiers. This visit was conducted via telephone due to the Covid-19 outbreak from my office at [facility] in [place], [place]. I discussed the limitations, risks, security and privacy concerns of performing an evaluation and management service by telephone. I also discussed with the patient that there may be a patient responsible charge related to this service. The patient expressed understanding and agreed to proceed.  Sinusitis This is a new problem. The current episode started in the past 7 days (2 days ago). The problem has been waxing and waning since onset. There has been no fever. The pain is mild. Associated symptoms include congestion, coughing, ear pain, a hoarse voice, sinus pressure and sneezing. Pertinent negatives include no chills, diaphoresis, shortness of breath or sore throat. (Vertigo) Past treatments include oral decongestants. The treatment provided mild relief.    Lab Results  Component Value Date   CREATININE 1.03 02/23/2019   BUN 15 02/23/2019   NA 140 02/23/2019   K 5.5 (H) 02/23/2019   CL 100 02/23/2019   CO2 26 02/23/2019   Lab Results  Component Value Date   CHOL 139 12/16/2018   HDL 54 12/16/2018   LDLCALC 71 12/16/2018   TRIG 68 12/16/2018   CHOLHDL 5.4 (H) 09/02/2017   Lab Results  Component Value Date   TSH 0.701 02/23/2019   No results found for: HGBA1C Lab Results  Component Value Date   WBC 6.5 09/28/2014   HGB 14.5 09/28/2014   HCT 43.3 09/28/2014   MCV 92.9 09/28/2014   PLT 181 09/28/2014   No results found for: ALT, AST, GGT, ALKPHOS, BILITOT   Review of Systems   Constitutional: Positive for fatigue. Negative for chills, diaphoresis and fever.  HENT: Positive for congestion, ear pain, hoarse voice, postnasal drip, sinus pressure, sinus pain and sneezing. Negative for ear discharge and sore throat.   Respiratory: Positive for cough. Negative for shortness of breath.     There are no problems to display for this patient.   No Known Allergies  Past Surgical History:  Procedure Laterality Date  . FRACTURE SURGERY     hand   . KNEE ARTHROSCOPY WITH ANTERIOR CRUCIATE LIGAMENT (ACL) REPAIR Left 09/29/2014   Procedure: KNEE ARTHROSCOPY WITH ANTERIOR CRUCIATE LIGAMENT (ACL) REPAIR;  Surgeon: Juanell Fairly, MD;  Location: [facility] ORS;  Service: Orthopedics;  Laterality: Left;    Social History   Tobacco Use  . Smoking status: Former Smoker    Packs/day: 1.50    Years: 12.00    Pack years: 18.00    Types: Cigarettes  . Smokeless tobacco: Never Used  Substance Use Topics  . Alcohol use: Yes    Comment: occ  . Drug use: No     Medication list has been reviewed and updated.  Current Meds  Medication Sig  . Multiple Vitamin (MULTIVITAMIN) capsule Take 1 capsule by mouth daily.  . Omega-3 Fatty Acids (FISH OIL PO) Take 1 capsule by mouth daily.  . vitamin C (ASCORBIC ACID) 500 MG tablet Take 500 mg by mouth daily.    PHQ 2/9 Scores 04/10/2020 06/23/2019 02/23/2019  08/07/2018  PHQ - 2 Score 0 0 0 0  PHQ- 9 Score 0 2 3 0    GAD 7 : Generalized Anxiety Score 04/10/2020 06/23/2019  Nervous, Anxious, on Edge 0 0  Control/stop worrying 0 0  Worry too much - different things 0 0  Trouble relaxing 0 0  Restless 0 0  Easily annoyed or irritable 0 0  Afraid - awful might happen 0 0  Total GAD 7 Score 0 0    BP Readings from Last 3 Encounters:  06/23/19 128/80  02/23/19 120/64  08/07/18 122/82    Physical Exam Nursing note reviewed.  HENT:     Nose:     Right Sinus: Maxillary sinus tenderness present.     Left Sinus: Maxillary sinus  tenderness present.  Neurological:     Mental Status: He is alert.     Wt Readings from Last 3 Encounters:  04/10/20 184 lb (83.5 kg)  06/23/19 184 lb (83.5 kg)  02/23/19 182 lb (82.6 kg)    Ht 5\' 5"  (1.651 m)   Wt 184 lb (83.5 kg)   BMI 30.62 kg/m   Assessment and Plan: 1. Acute maxillary sinusitis, recurrence not specified Patient presents with televisit for sinusitis which is developed over the course of his recovery from Covid.  Patient has tenderness in the and post nasal drainage as well as ear concerns.  He was told that he had an erythematous tympanic membrane he may have had a an otitis media during this time as well, and therefore we will select amoxicillin 500 mg 3 times a day for 10 days and patient has been encouraged to continue over-the-counter decongestant. - amoxicillin (AMOXIL) 500 MG capsule; Take 1 capsule (500 mg total) by mouth 3 (three) times daily.  Dispense: 30 capsule; Refill: 0 I spent 12 minutes with this patient, More than 50% of that time was spent in face to face education, counseling and care coordination.

## 2020-04-10 NOTE — Telephone Encounter (Signed)
Please put him down for VV at 300 and hit arrived

## 2020-04-10 NOTE — Telephone Encounter (Unsigned)
Copied from CRM [id number]. Topic: Quick Communication - See Telephone Encounter >> Apr 10, 2020 11:43 AM Aretta Nip wrote: CRM for notification. See Telephone encounter for: 04/10/20.CB pt for [facility] failed DT, Covid in last 7 days, Sinus issues dizzy, lots of pressure [phone]

## 2020-08-03 DIAGNOSIS — M25561 Pain in right knee: Secondary | ICD-10-CM | POA: Diagnosis not present

## 2020-08-16 DIAGNOSIS — M25561 Pain in right knee: Secondary | ICD-10-CM | POA: Diagnosis not present

## 2020-09-04 DIAGNOSIS — M1711 Unilateral primary osteoarthritis, right knee: Secondary | ICD-10-CM | POA: Diagnosis not present

## 2020-09-12 DIAGNOSIS — D2261 Melanocytic nevi of right upper limb, including shoulder: Secondary | ICD-10-CM | POA: Diagnosis not present

## 2020-09-12 DIAGNOSIS — D2262 Melanocytic nevi of left upper limb, including shoulder: Secondary | ICD-10-CM | POA: Diagnosis not present

## 2020-09-12 DIAGNOSIS — L578 Other skin changes due to chronic exposure to nonionizing radiation: Secondary | ICD-10-CM | POA: Diagnosis not present

## 2020-09-12 DIAGNOSIS — D225 Melanocytic nevi of trunk: Secondary | ICD-10-CM | POA: Diagnosis not present

## 2020-10-12 DIAGNOSIS — S46202A Unspecified injury of muscle, fascia and tendon of other parts of biceps, left arm, initial encounter: Secondary | ICD-10-CM | POA: Diagnosis not present

## 2020-11-22 DIAGNOSIS — Z20822 Contact with and (suspected) exposure to covid-19: Secondary | ICD-10-CM | POA: Diagnosis not present

## 2020-11-22 DIAGNOSIS — U071 COVID-19: Secondary | ICD-10-CM | POA: Diagnosis not present

## 2021-01-24 DIAGNOSIS — M1711 Unilateral primary osteoarthritis, right knee: Secondary | ICD-10-CM | POA: Diagnosis not present

## 2021-02-01 ENCOUNTER — Encounter: Payer: Self-pay | Admitting: Physical Therapy

## 2021-02-01 ENCOUNTER — Ambulatory Visit: Payer: BC Managed Care – PPO | Attending: Orthopedic Surgery | Admitting: Physical Therapy

## 2021-02-01 ENCOUNTER — Other Ambulatory Visit: Payer: Self-pay

## 2021-02-01 VITALS — BP 157/98 | HR 71

## 2021-02-01 DIAGNOSIS — M25561 Pain in right knee: Secondary | ICD-10-CM | POA: Diagnosis not present

## 2021-02-01 DIAGNOSIS — G8929 Other chronic pain: Secondary | ICD-10-CM | POA: Insufficient documentation
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
## 2021-02-02 ENCOUNTER — Telehealth: Payer: Self-pay

## 2021-02-02 NOTE — Telephone Encounter (Signed)
Patient called in from missed call was informed that he will have labs done at his visit

## 2021-02-02 NOTE — Telephone Encounter (Signed)
Copied from CRM [id number]. Topic: General - Other >> Feb 01, 2021  4:06 PM Gaetana Michaelis A wrote: Reason for CRM: The patient will be seen at 10:20 on 02/16/21  The patient would like to request orders for lab work   Please contact further if needed

## 2021-02-05 ENCOUNTER — Encounter: Payer: BLUE CROSS/BLUE SHIELD | Admitting: Physical Therapy

## 2021-02-08 ENCOUNTER — Telehealth: Payer: Self-pay | Admitting: Physical Therapy

## 2021-02-08 ENCOUNTER — Ambulatory Visit: Payer: BC Managed Care – PPO | Admitting: Physical Therapy

## 2021-02-08 DIAGNOSIS — M25561 Pain in right knee: Secondary | ICD-10-CM | POA: Diagnosis not present

## 2021-02-08 DIAGNOSIS — G8929 Other chronic pain: Secondary | ICD-10-CM
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
## 2021-02-08 NOTE — Telephone Encounter (Signed)
Called patient to inquiry about absence from PT. Did not reach so VM left instructing patient to call back and check in.

## 2021-02-12 ENCOUNTER — Encounter: Payer: BC Managed Care – PPO | Admitting: Physical Therapy

## 2021-02-12 ENCOUNTER — Encounter: Payer: BLUE CROSS/BLUE SHIELD | Admitting: Physical Therapy

## 2021-02-15 ENCOUNTER — Encounter: Payer: BLUE CROSS/BLUE SHIELD | Admitting: Physical Therapy

## 2021-02-16 ENCOUNTER — Ambulatory Visit: Payer: BC Managed Care – PPO | Admitting: Family Medicine

## 2021-02-16 ENCOUNTER — Other Ambulatory Visit: Payer: Self-pay

## 2021-02-16 ENCOUNTER — Encounter: Payer: Self-pay | Admitting: Family Medicine

## 2021-02-16 VITALS — BP 126/64 | HR 57 | Ht 65.0 in | Wt 167.4 lb

## 2021-02-16 DIAGNOSIS — E785 Hyperlipidemia, unspecified: Secondary | ICD-10-CM

## 2021-02-16 DIAGNOSIS — Z0000 Encounter for general adult medical examination without abnormal findings: Secondary | ICD-10-CM | POA: Diagnosis not present

## 2021-02-16 NOTE — Progress Notes (Signed)
Date:  02/16/2021   Name:  Kenneth Simmons   DOB:  Apr 12, 1975   MRN:  [id number]   Chief Complaint: No chief complaint on file.  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 The patient reports the following problems: none. Health maintenance has been reviewed up to date.     Lab Results  Component Value Date   CREATININE 1.03 02/23/2019   BUN 15 02/23/2019   NA 140 02/23/2019   K 5.5 (H) 02/23/2019   CL 100 02/23/2019   CO2 26 02/23/2019   Lab Results  Component Value Date   CHOL 139 12/16/2018   HDL 54 12/16/2018   LDLCALC 71 12/16/2018   TRIG 68 12/16/2018   CHOLHDL 5.4 (H) 09/02/2017   Lab Results  Component Value Date   TSH 0.701 02/23/2019   No results found for: HGBA1C Lab Results  Component Value Date   WBC 6.5 09/28/2014   HGB 14.5 09/28/2014   HCT 43.3 09/28/2014   MCV 92.9 09/28/2014   PLT 181 09/28/2014   No results found for: ALT, AST, GGT, ALKPHOS, BILITOT No components found for: VITD  Review of Systems  Constitutional:  Negative for chills and fever.  HENT:  Negative for drooling, ear discharge, ear pain and sore throat.   Respiratory:  Negative for cough, shortness of breath and wheezing.   Cardiovascular:  Negative for chest pain, palpitations and leg swelling.  Gastrointestinal:  Negative for abdominal pain, blood in stool, constipation, diarrhea and nausea.  Endocrine: Negative for polydipsia.  Genitourinary:  Negative for dysuria, frequency, hematuria and urgency.  Musculoskeletal:  Negative for back pain, myalgias and neck pain.  Skin:  Negative for rash.  Allergic/Immunologic: Negative for environmental allergies.  Neurological:  Negative for dizziness and headaches.  Hematological:  Does not bruise/bleed easily.  Psychiatric/Behavioral:  Negative for suicidal ideas. The patient is not nervous/anxious.    There are no problems to display for this patient.   No Known Allergies  Past Surgical History:   Procedure Laterality Date   FRACTURE SURGERY     hand    KNEE ARTHROSCOPY WITH ANTERIOR CRUCIATE LIGAMENT (ACL) REPAIR Left 09/29/2014   Procedure: KNEE ARTHROSCOPY WITH ANTERIOR CRUCIATE LIGAMENT (ACL) REPAIR;  Surgeon: Juanell Fairly, MD;  Location: [facility] ORS;  Service: Orthopedics;  Laterality: Left;    Social History   Tobacco Use   Smoking status: Former    Packs/day: 1.50    Years: 12.00    Pack years: 18.00    Types: Cigarettes   Smokeless tobacco: Never  Substance Use Topics   Alcohol use: Yes    Comment: occ   Drug use: No     Medication list has been reviewed and updated.  Current Meds  Medication Sig   Multiple Vitamin (MULTIVITAMIN) capsule Take 1 capsule by mouth daily.   Omega-3 Fatty Acids (FISH OIL PO) Take 1 capsule by mouth daily.   vitamin C (ASCORBIC ACID) 500 MG tablet Take 500 mg by mouth daily.    PHQ 2/9 Scores 02/16/2021 04/10/2020 06/23/2019 02/23/2019  PHQ - 2 Score 0 0 0 0  PHQ- 9 Score 0 0 2 3    GAD 7 : Generalized Anxiety Score 02/16/2021 04/10/2020 06/23/2019  Nervous, Anxious, on Edge 0 0 0  Control/stop worrying 0 0 0  Worry too much - different things 0 0 0  Trouble relaxing 0 0 0  Restless 0 0 0  Easily annoyed or  irritable 0 0 0  Afraid - awful might happen 0 0 0  Total GAD 7 Score 0 0 0    BP Readings from Last 3 Encounters:  02/16/21 126/64  02/01/21 (!) 157/98  06/23/19 128/80    Physical Exam Vitals and nursing note reviewed.  HENT:     Head: Normocephalic.     Right Ear: Tympanic membrane, ear canal and external ear normal.     Left Ear: Tympanic membrane, ear canal and external ear normal.     Nose: Nose normal. No congestion or rhinorrhea.  Eyes:     General: No scleral icterus.       Right eye: No discharge.        Left eye: No discharge.     Conjunctiva/sclera: Conjunctivae normal.     Pupils: Pupils are equal, round, and reactive to light.  Neck:     Thyroid: No thyromegaly.     Vascular: No JVD.      Trachea: No tracheal deviation.  Cardiovascular:     Rate and Rhythm: Normal rate and regular rhythm.     Heart sounds: Normal heart sounds. No murmur heard.   No friction rub. No gallop.  Pulmonary:     Effort: No respiratory distress.     Breath sounds: Normal breath sounds. No wheezing, rhonchi or rales.  Abdominal:     General: Bowel sounds are normal.     Palpations: Abdomen is soft. There is no mass.     Tenderness: There is no abdominal tenderness. There is no guarding or rebound.  Musculoskeletal:        General: No tenderness. Normal range of motion.     Cervical back: Normal range of motion and neck supple.  Lymphadenopathy:     Cervical: No cervical adenopathy.  Skin:    General: Skin is warm.     Findings: No rash.  Neurological:     Mental Status: He is alert and oriented to person, place, and time.     Cranial Nerves: No cranial nerve deficit.     Deep Tendon Reflexes: Reflexes are normal and symmetric.    Wt Readings from Last 3 Encounters:  02/16/21 167 lb 6.4 oz (75.9 kg)  04/10/20 184 lb (83.5 kg)  06/23/19 184 lb (83.5 kg)    BP 126/64   Pulse (!) 57   Ht 5\' 5"  (1.651 m)   Wt 167 lb 6.4 oz (75.9 kg)   BMI 27.86 kg/m   Assessment and Plan:  1. Health maintenance examination New onset.  Patient has recently been discontinued on his blood pressure medicine because he is lost weight and is eating more properly.  Blood pressure is excellent at 126/64.  Exam was done there was no issues on review of systems or physical exam.  Patient will receive comprehensive metabolic panel and lipid panel for insurance purposes. - Comprehensive metabolic panel - Lipid Panel With LDL/HDL Ratio  2. Hyperlipidemia, unspecified hyperlipidemia type Chronic.  Controlled.  Stable.  This is controlled with diet.  Will check lipid panel for current level of control of LDL. - Lipid Panel With LDL/HDL Ratio

## 2021-02-17 LAB — COMPREHENSIVE METABOLIC PANEL
ALT: 31 IU/L (ref 0–44)
AST: 25 IU/L (ref 0–40)
Albumin/Globulin Ratio: 2.6 — ABNORMAL HIGH (ref 1.2–2.2)
Albumin: 4.7 g/dL (ref 4.0–5.0)
Alkaline Phosphatase: 51 IU/L (ref 44–121)
BUN/Creatinine Ratio: 15 (ref 9–20)
BUN: 14 mg/dL (ref 6–24)
Bilirubin Total: 0.5 mg/dL (ref 0.0–1.2)
CO2: 26 mmol/L (ref 20–29)
Calcium: 9.6 mg/dL (ref 8.7–10.2)
Chloride: 101 mmol/L (ref 96–106)
Creatinine, Ser: 0.94 mg/dL (ref 0.76–1.27)
Globulin, Total: 1.8 g/dL (ref 1.5–4.5)
Glucose: 104 mg/dL — ABNORMAL HIGH (ref 70–99)
Potassium: 5.1 mmol/L (ref 3.5–5.2)
Sodium: 141 mmol/L (ref 134–144)
Total Protein: 6.5 g/dL (ref 6.0–8.5)
eGFR: 102 mL/min/{1.73_m2} (ref >59)

## 2021-02-17 LAB — LIPID PANEL WITH LDL/HDL RATIO
Cholesterol, Total: 152 mg/dL (ref 100–199)
HDL: 51 mg/dL (ref >39)
LDL Chol Calc (NIH): 92 mg/dL (ref 0–99)
LDL/HDL Ratio: 1.8 ratio (ref 0.0–3.6)
Triglycerides: 42 mg/dL (ref 0–149)
VLDL Cholesterol Cal: 9 mg/dL (ref 5–40)

## 2021-02-19 ENCOUNTER — Telehealth: Payer: Self-pay | Admitting: Family Medicine

## 2021-03-07 DIAGNOSIS — M1711 Unilateral primary osteoarthritis, right knee: Secondary | ICD-10-CM | POA: Diagnosis not present

## 2021-05-11 DIAGNOSIS — M1711 Unilateral primary osteoarthritis, right knee: Secondary | ICD-10-CM | POA: Diagnosis not present

## 2021-06-28 DIAGNOSIS — M76891 Other specified enthesopathies of right lower limb, excluding foot: Secondary | ICD-10-CM | POA: Diagnosis not present

## 2021-06-28 DIAGNOSIS — M25461 Effusion, right knee: Secondary | ICD-10-CM | POA: Diagnosis not present

## 2021-06-28 DIAGNOSIS — M1711 Unilateral primary osteoarthritis, right knee: Secondary | ICD-10-CM | POA: Diagnosis not present

## 2021-12-09 DIAGNOSIS — M545 Low back pain, unspecified: Secondary | ICD-10-CM | POA: Diagnosis not present

## 2022-02-18 ENCOUNTER — Encounter: Payer: BC Managed Care – PPO | Admitting: Family Medicine

## 2022-03-14 ENCOUNTER — Encounter: Payer: BC Managed Care – PPO | Admitting: Family Medicine

## 2022-04-04 ENCOUNTER — Encounter: Payer: BC Managed Care – PPO | Admitting: Family Medicine

## 2022-04-29 ENCOUNTER — Encounter: Payer: BC Managed Care – PPO | Admitting: Family Medicine

## 2023-01-06 ENCOUNTER — Ambulatory Visit
Admission: RE | Admit: 2023-01-06 | Discharge: 2023-01-06 | Disposition: A | Discharge status: Home / Self Care | Payer: BC Managed Care – PPO | Source: Ambulatory Visit | Attending: Internal Medicine | Admitting: Internal Medicine

## 2023-01-06 ENCOUNTER — Other Ambulatory Visit: Payer: Self-pay | Admitting: Internal Medicine

## 2023-01-06 DIAGNOSIS — M5410 Radiculopathy, site unspecified: Secondary | ICD-10-CM | POA: Diagnosis not present

## 2023-01-06 DIAGNOSIS — I10 Essential (primary) hypertension: Secondary | ICD-10-CM | POA: Diagnosis not present

## 2023-01-06 DIAGNOSIS — M25551 Pain in right hip: Secondary | ICD-10-CM | POA: Diagnosis not present

## 2023-01-06 DIAGNOSIS — M79661 Pain in right lower leg: Secondary | ICD-10-CM

## 2023-01-09 ENCOUNTER — Ambulatory Visit
Admission: RE | Admit: 2023-01-09 | Discharge: 2023-01-09 | Disposition: A | Discharge status: Home / Self Care | Payer: BC Managed Care – PPO | Source: Ambulatory Visit | Attending: Family Medicine | Admitting: Family Medicine

## 2023-01-09 ENCOUNTER — Other Ambulatory Visit: Payer: Self-pay | Admitting: Family Medicine

## 2023-01-09 DIAGNOSIS — M5136 Other intervertebral disc degeneration, lumbar region with discogenic back pain only: Secondary | ICD-10-CM | POA: Diagnosis not present

## 2023-01-09 DIAGNOSIS — M5416 Radiculopathy, lumbar region: Secondary | ICD-10-CM | POA: Insufficient documentation

## 2023-01-09 DIAGNOSIS — R200 Anesthesia of skin: Secondary | ICD-10-CM | POA: Diagnosis not present

## 2023-01-09 DIAGNOSIS — M51379 Other intervertebral disc degeneration, lumbosacral region without mention of lumbar back pain or lower extremity pain: Secondary | ICD-10-CM | POA: Diagnosis not present

## 2023-01-09 DIAGNOSIS — M51369 Other intervertebral disc degeneration, lumbar region without mention of lumbar back pain or lower extremity pain: Secondary | ICD-10-CM | POA: Diagnosis not present

## 2023-01-09 DIAGNOSIS — M48061 Spinal stenosis, lumbar region without neurogenic claudication: Secondary | ICD-10-CM | POA: Diagnosis not present

## 2023-01-14 DIAGNOSIS — M5416 Radiculopathy, lumbar region: Secondary | ICD-10-CM | POA: Diagnosis not present

## 2023-01-14 DIAGNOSIS — M5136 Other intervertebral disc degeneration, lumbar region with discogenic back pain only: Secondary | ICD-10-CM | POA: Diagnosis not present

## 2023-01-14 DIAGNOSIS — M5126 Other intervertebral disc displacement, lumbar region: Secondary | ICD-10-CM | POA: Diagnosis not present

## 2023-01-23 ENCOUNTER — Encounter: Payer: Self-pay | Admitting: Physician Assistant

## 2023-01-23 ENCOUNTER — Ambulatory Visit: Payer: Self-pay

## 2023-01-23 ENCOUNTER — Ambulatory Visit: Payer: BC Managed Care – PPO | Admitting: Physician Assistant

## 2023-01-23 VITALS — BP 138/78 | HR 78 | Ht 65.0 in | Wt 192.0 lb

## 2023-01-23 DIAGNOSIS — Z789 Other specified health status: Secondary | ICD-10-CM | POA: Diagnosis not present

## 2023-01-23 DIAGNOSIS — Z13220 Encounter for screening for lipoid disorders: Secondary | ICD-10-CM

## 2023-01-23 DIAGNOSIS — M5416 Radiculopathy, lumbar region: Secondary | ICD-10-CM | POA: Diagnosis not present

## 2023-01-23 DIAGNOSIS — I10 Essential (primary) hypertension: Secondary | ICD-10-CM | POA: Diagnosis not present

## 2023-01-23 DIAGNOSIS — R739 Hyperglycemia, unspecified: Secondary | ICD-10-CM | POA: Diagnosis not present

## 2023-01-23 MED ORDER — LISINOPRIL 10 MG PO TABS: 10.0000 mg | ORAL_TABLET | Freq: Every day | ORAL | 1 refills | Status: DC

## 2023-01-23 NOTE — Patient Instructions (Addendum)
-  It was a pleasure to see you today! Please review your visit summary for helpful information -Lab results are usually available within 1-2 days and we will call once reviewed -I would encourage you to follow your care via MyChart where you can access lab results, notes, messages, and more -If you feel that we did a nice job today, please complete your after-visit survey and leave [place] a Google review! Your CMA today was Mariann Barter and your provider was Alvester Morin, PA-C, DMSc -Please return for follow-up in about 2 weeks  Reviewed the "Seven S's" of hypertension including salt, smoking, stimulants (e.g. caffeine), stress, sleep, snoring (OSA), sedentary lifestyle.

## 2023-01-23 NOTE — Progress Notes (Signed)
Date:  01/23/2023   Name:  Kenneth Simmons   DOB:  11-06-1975   MRN:  [id number]   Chief Complaint: Hypertension (167/91 today )  HPI Kenneth Simmons is a pleasant 47 year old male with a history of HTN and chronic back pain new to me today, typically sees my colleague Dr. Elizabeth Sauer, here for evaluation of high blood pressure as recorded at several recent visits with his spine doctor.  Patient occasionally monitors blood pressure at home using automatic arm cuff with recent readings in the 150s-160s systolic.  He believes this is indirect relationship to weight gain of about 25 pounds for the last 2 years or so, which is confirmed by chart review.  Distantly he was on lisinopril-HCTZ for management of his blood pressure, but was able to stop the medication and maintain healthy blood pressure with lifestyle changes; he would like to do this again.   Medication list has been reviewed and updated.  Current Meds  Medication Sig   BLACK CURRANT SEED OIL PO Take by mouth.   Cholecalciferol (VITAMIN D3 PO) Take by mouth.   CREATINE PO Take by mouth.   Cyanocobalamin (VITAMIN B12 PO) Take by mouth.   GARLIC PO Take by mouth.   lisinopril (ZESTRIL) 10 MG tablet Take 1 tablet (10 mg total) by mouth daily.   MAGNESIUM PO Take by mouth.   Multiple Vitamin (MULTIVITAMIN) capsule Take 1 capsule by mouth daily.   Omega-3 Fatty Acids (FISH OIL PO) Take 1 capsule by mouth daily.   POTASSIUM PO Take by mouth.   Turmeric (QC TUMERIC COMPLEX PO) Take by mouth.   vitamin C (ASCORBIC ACID) 500 MG tablet Take 500 mg by mouth daily.     Review of Systems  Constitutional:  Negative for fatigue and fever.  Respiratory:  Negative for chest tightness and shortness of breath.   Cardiovascular:  Negative for chest pain and palpitations.  Gastrointestinal:  Negative for abdominal pain.    Patient Active Problem List   Diagnosis Date Noted   Primary hypertension 01/23/2023    No Known  Allergies  Immunization History  Administered Date(s) Administered   Td 09/02/2017    Past Surgical History:  Procedure Laterality Date   FRACTURE SURGERY     hand    KNEE ARTHROSCOPY WITH ANTERIOR CRUCIATE LIGAMENT (ACL) REPAIR Left 09/29/2014   Procedure: KNEE ARTHROSCOPY WITH ANTERIOR CRUCIATE LIGAMENT (ACL) REPAIR;  Surgeon: Juanell Fairly, MD;  Location: [facility] ORS;  Service: Orthopedics;  Laterality: Left;    Social History   Tobacco Use   Smoking status: Former    Current packs/day: 1.50    Average packs/day: 1.5 packs/day for 12.0 years (18.0 ttl pk-yrs)    Types: Cigarettes   Smokeless tobacco: Never  Substance Use Topics   Alcohol use: Yes    Comment: occ   Drug use: No    History reviewed. No pertinent family history.      01/23/2023   11:00 AM 02/16/2021   10:41 AM 04/10/2020    1:41 PM 06/23/2019    8:28 AM  GAD 7 : Generalized Anxiety Score  Nervous, Anxious, on Edge 1 0 0 0  Control/stop worrying 1 0 0 0  Worry too much - different things 1 0 0 0  Trouble relaxing 2 0 0 0  Restless 1 0 0 0  Easily annoyed or irritable 0 0 0 0  Afraid - awful might happen 0 0 0 0  Total GAD 7 Score 6 0  0 0  Anxiety Difficulty Not difficult at all          01/23/2023   10:59 AM 02/16/2021   10:41 AM 04/10/2020    1:41 PM  Depression screen PHQ 2/9  Decreased Interest 0 0 0  Down, Depressed, Hopeless 0 0 0  PHQ - 2 Score 0 0 0  Altered sleeping 0 0 0  Tired, decreased energy 1 0 0  Change in appetite 0 0 0  Feeling bad or failure about yourself  0 0 0  Trouble concentrating 0 0 0  Moving slowly or fidgety/restless 0 0 0  Suicidal thoughts 0 0 0  PHQ-9 Score 1 0 0  Difficult doing work/chores Not difficult at all Not difficult at all     BP Readings from Last 3 Encounters:  01/23/23 138/78  02/16/21 126/64  02/01/21 (!) 157/98    Wt Readings from Last 3 Encounters:  01/23/23 192 lb (87.1 kg)  02/16/21 167 lb 6.4 oz (75.9 kg)  04/10/20 184 lb (83.5  kg)    BP 138/78 (BP Location: Left Arm, Patient Position: Sitting, Cuff Size: Large)   Pulse 78   Ht 5\' 5"  (1.651 m)   Wt 192 lb (87.1 kg)   SpO2 96%   BMI 31.95 kg/m   Physical Exam Vitals and nursing note reviewed.  Constitutional:      Appearance: Normal appearance.     Comments: Muscular build  Neck:     Vascular: No carotid bruit.  Cardiovascular:     Rate and Rhythm: Normal rate and regular rhythm.     Heart sounds: No murmur heard.    No friction rub. No gallop.  Pulmonary:     Effort: Pulmonary effort is normal.     Breath sounds: Normal breath sounds.  Abdominal:     General: There is no distension.  Musculoskeletal:        General: Normal range of motion.  Skin:    General: Skin is warm and dry.  Neurological:     Mental Status: He is alert and oriented to person, place, and time.     Gait: Gait is intact.  Psychiatric:        Mood and Affect: Mood and affect normal.     Recent Labs     Component Value Date/Time   NA 141 02/16/2021 1118   K 5.1 02/16/2021 1118   CL 101 02/16/2021 1118   CO2 26 02/16/2021 1118   GLUCOSE 104 (H) 02/16/2021 1118   GLUCOSE 113 (H) 09/28/2014 0929   BUN 14 02/16/2021 1118   CREATININE 0.94 02/16/2021 1118   CALCIUM 9.6 02/16/2021 1118   PROT 6.5 02/16/2021 1118   ALBUMIN 4.7 02/16/2021 1118   AST 25 02/16/2021 1118   ALT 31 02/16/2021 1118   ALKPHOS 51 02/16/2021 1118   BILITOT 0.5 02/16/2021 1118   GFRNONAA 89 02/23/2019 0938   GFRAA 102 02/23/2019 0938    Lab Results  Component Value Date   WBC 6.5 09/28/2014   HGB 14.5 09/28/2014   HCT 43.3 09/28/2014   MCV 92.9 09/28/2014   PLT 181 09/28/2014   No results found for: "HGBA1C" Lab Results  Component Value Date   CHOL 152 02/16/2021   HDL 51 02/16/2021   LDLCALC 92 02/16/2021   TRIG 42 02/16/2021   CHOLHDL 5.4 (H) 09/02/2017   Lab Results  Component Value Date   TSH 0.701 02/23/2019     Assessment and Plan:  1. Primary hypertension  Blood  pressure slightly elevated x 2 today.  Will restart low-dose lisinopril, at least for the short-term.  Patient seems well-motivated to reduce blood pressure through lifestyle changes.  Encouraged home blood pressure monitoring with at least 3 readings per week, noting blood pressures in a log for review next time.  Reviewed the "Seven S's" of hypertension including salt, smoking, stimulants (e.g. caffeine), stress, sleep, snoring (OSA), sedentary lifestyle.  Due for baseline labs, will check on these today. - CBC with Differential/Platelet - Comprehensive metabolic panel - TSH - lisinopril (ZESTRIL) 10 MG tablet; Take 1 tablet (10 mg total) by mouth daily.  Dispense: 30 tablet; Refill: 1  2. Screening for hyperlipidemia - Lipid panel  3. Supplement Use Briefly reviewed with patient today.  He has quite a few vitamins/supplements on his list.  Mentioned that if he is taking a multivitamin, is not likely to derive additional benefit from separate vitamins such as B12, D3, magnesium, etc. if he has a highly nutritive diet, he likely does not need the multivitamin either.   Return in about 4 weeks (around 02/20/2023) for OV f/u HTN.    Alvester Morin, PA-C, DMSc, Nutritionist [facility] [phone]

## 2023-01-23 NOTE — Telephone Encounter (Signed)
     Chief Complaint: Pt. States he was in a doctor's office this morning for steroid injection in his back and BP was 168/91. Has a headache. Used to be on BP medication, but it was stopped a "couple of years ago." Symptoms: Headache Frequency: Today Pertinent Negatives: Patient denies any other symptoms Disposition: [] ED /[] Urgent Care (no appt availability in office) / [x] Appointment(In office/virtual)/ []  [facility]/ [] Home Care/ [] Refused Recommended Disposition /[] [facility]/ []  Follow-up with PCP Additional Notes: Will go to ED for worsening of symptoms. Agrees with appointment.  Reason for Disposition  Systolic BP  >= 180 OR Diastolic >= 110  Answer Assessment - Initial Assessment Questions 1. BLOOD PRESSURE: "What is the blood pressure?" "Did you take at least two measurements 5 minutes apart?"     168/91 2. ONSET: "When did you take your blood pressure?"     Today 3. HOW: "How did you take your blood pressure?" (e.g., automatic home BP monitor, visiting nurse)     Doctor's office 4. HISTORY: "Do you have a history of high blood pressure?"     Yes 5. MEDICINES: "Are you taking any medicines for blood pressure?" "Have you missed any doses recently?"     No 6. OTHER SYMPTOMS: "Do you have any symptoms?" (e.g., blurred vision, chest pain, difficulty breathing, headache, weakness)     Headache 7. PREGNANCY: "Is there any chance you are pregnant?" "When was your last menstrual period?"     N/A  Protocols used: Blood Pressure - High-A-AH

## 2023-01-24 ENCOUNTER — Telehealth: Payer: Self-pay | Admitting: Family Medicine

## 2023-01-24 LAB — CBC WITH DIFFERENTIAL/PLATELET
Basophils Absolute: 0 10*3/uL (ref 0.0–0.2)
Basos: 0 %
EOS (ABSOLUTE): 0 10*3/uL (ref 0.0–0.4)
Eos: 0 %
Hematocrit: 44.6 % (ref 37.5–51.0)
Hemoglobin: 15.1 g/dL (ref 13.0–17.7)
Immature Grans (Abs): 0 10*3/uL (ref 0.0–0.1)
Immature Granulocytes: 0 %
Lymphocytes Absolute: 0.7 10*3/uL (ref 0.7–3.1)
Lymphs: 10 %
MCH: 32.1 pg (ref 26.6–33.0)
MCHC: 33.9 g/dL (ref 31.5–35.7)
MCV: 95 fL (ref 79–97)
Monocytes Absolute: 0.1 10*3/uL (ref 0.1–0.9)
Monocytes: 1 %
Neutrophils Absolute: 6.1 10*3/uL (ref 1.4–7.0)
Neutrophils: 89 %
Platelets: 210 10*3/uL (ref 150–450)
RBC: 4.7 x10E6/uL (ref 4.14–5.80)
RDW: 12 % (ref 11.6–15.4)
WBC: 7 10*3/uL (ref 3.4–10.8)

## 2023-01-24 LAB — COMPREHENSIVE METABOLIC PANEL
ALT: 29 [IU]/L (ref 0–44)
AST: 32 [IU]/L (ref 0–40)
Albumin: 4.9 g/dL (ref 4.1–5.1)
Alkaline Phosphatase: 53 [IU]/L (ref 44–121)
BUN/Creatinine Ratio: 18 (ref 9–20)
BUN: 18 mg/dL (ref 6–24)
Bilirubin Total: 0.3 mg/dL (ref 0.0–1.2)
CO2: 26 mmol/L (ref 20–29)
Calcium: 9.6 mg/dL (ref 8.7–10.2)
Chloride: 102 mmol/L (ref 96–106)
Creatinine, Ser: 1.02 mg/dL (ref 0.76–1.27)
Globulin, Total: 2.1 g/dL (ref 1.5–4.5)
Glucose: 133 mg/dL — ABNORMAL HIGH (ref 70–99)
Potassium: 5 mmol/L (ref 3.5–5.2)
Sodium: 140 mmol/L (ref 134–144)
Total Protein: 7 g/dL (ref 6.0–8.5)
eGFR: 91 mL/min/{1.73_m2} (ref >59)

## 2023-01-24 LAB — LIPID PANEL
Chol/HDL Ratio: 3.8 ratio (ref 0.0–5.0)
Cholesterol, Total: 206 mg/dL — ABNORMAL HIGH (ref 100–199)
HDL: 54 mg/dL (ref >39)
LDL Chol Calc (NIH): 136 mg/dL — ABNORMAL HIGH (ref 0–99)
Triglycerides: 90 mg/dL (ref 0–149)
VLDL Cholesterol Cal: 16 mg/dL (ref 5–40)

## 2023-01-24 LAB — TSH: TSH: 0.81 u[IU]/mL (ref 0.450–4.500)

## 2023-01-24 NOTE — Telephone Encounter (Signed)
Copied from CRM [id number]. Topic: General - Other >> Jan 24, 2023 12:15 PM Dondra Prader E wrote: Reason for CRM: Pt called back to report that 4 hours before his appt, he had epidural steroid injections in his lower back. He believes this could be the cause of the elevated sugar levels.   Best contact:  [phone]

## 2023-01-24 NOTE — Telephone Encounter (Signed)
Noted  KP 

## 2023-01-25 ENCOUNTER — Encounter: Payer: Self-pay | Admitting: Physician Assistant

## 2023-01-25 DIAGNOSIS — R7303 Prediabetes: Secondary | ICD-10-CM | POA: Insufficient documentation

## 2023-01-25 LAB — SPECIMEN STATUS REPORT

## 2023-01-25 LAB — HGB A1C W/O EAG: Hgb A1c MFr Bld: 5.8 % — ABNORMAL HIGH (ref 4.8–5.6)

## 2023-02-13 DIAGNOSIS — M5416 Radiculopathy, lumbar region: Secondary | ICD-10-CM | POA: Diagnosis not present

## 2023-02-13 DIAGNOSIS — M5126 Other intervertebral disc displacement, lumbar region: Secondary | ICD-10-CM | POA: Diagnosis not present

## 2023-02-18 ENCOUNTER — Telehealth: Payer: Self-pay | Admitting: Family Medicine

## 2023-02-18 ENCOUNTER — Encounter: Payer: Self-pay | Admitting: *Deleted

## 2023-02-18 DIAGNOSIS — I10 Essential (primary) hypertension: Secondary | ICD-10-CM

## 2023-02-18 NOTE — Telephone Encounter (Signed)
Patient returned call regarding medication refill. Reviewed message from L. Covington, RN and notified patient he should have a refill ready for pick up at [facility] pharmacy. Patient verbalized understanding.

## 2023-02-18 NOTE — Telephone Encounter (Signed)
Medication Refill -  Most Recent Primary Care Visit:  Provider: Remo Lipps  Department: [facility]  Visit Type: SAME DAY  Date: 01/23/2023  Medication: lisinopril (ZESTRIL) 10 MG tablet   Has the patient contacted their pharmacy? no Pt called in because needed an appt also  Is this the correct pharmacy for this prescription? yes  This is the patient's preferred pharmacy:  [facility] [address] ([address]), [place] - [address] [place]) [place] [address] Phone: [phone] Fax: [phone]   Has the prescription been filled recently? no  Is the patient out of the medication? No has 3 days left  Has the patient been seen for an appointment in the last year OR does the patient have an upcoming appointment? yes  Can we respond through MyChart? yes  Agent: Please be advised that Rx refills may take up to 3 business days. We ask that you follow-up with your pharmacy.

## 2023-02-18 NOTE — Telephone Encounter (Signed)
This encounter was created in error - please disregard. See previous NT medication refill call

## 2023-02-18 NOTE — Telephone Encounter (Signed)
Patient called, no answer, recording call could not be competed at this time, try call again later. [facility] called and spoke to [facility] Pensions consultant about the refill(s) lisinopril requested. Advised it was sent on 01/23/23 #30/1 refill(s). She says they have the 1 refill so they can get that ready for him to pickup today.

## 2023-03-18 ENCOUNTER — Other Ambulatory Visit: Payer: Self-pay | Admitting: Family Medicine

## 2023-03-18 DIAGNOSIS — I10 Essential (primary) hypertension: Secondary | ICD-10-CM

## 2023-03-18 MED ORDER — LISINOPRIL 10 MG PO TABS: 10.0000 mg | ORAL_TABLET | Freq: Every day | ORAL | 1 refills | Status: DC

## 2023-03-18 NOTE — Telephone Encounter (Signed)
Medication Refill -  Most Recent Primary Care Visit:  Provider: Remo Lipps  Department: [facility]  Visit Type: SAME DAY  Date: 01/23/2023  Medication: Lisinopril 100 mg  Has the patient contacted their pharmacy? No (Agent: If no, request that the patient contact the pharmacy for the refill. If patient does not wish to contact the pharmacy document the reason why and proceed with request.) (Agent: If yes, when and what did the pharmacy advise?)  Is this the correct pharmacy for this prescription? Yes If no, delete pharmacy and type the correct one.  This is the patient's preferred pharmacy:  [facility] [address] ([address]), [place] - [address] [place]) [place] [address] Phone: [phone] Fax: [phone]   Has the prescription been filled recently? yes  Is the patient out of the medication? No  Has the patient been seen for an appointment in the last year OR does the patient have an upcoming appointment? Yes  Can we respond through MyChart? Yes  Agent: Please be advised that Rx refills may take up to 3 business days. We ask that you follow-up with your pharmacy.

## 2023-03-18 NOTE — Telephone Encounter (Signed)
Requested Prescriptions  Pending Prescriptions Disp Refills   lisinopril (ZESTRIL) 10 MG tablet 30 tablet 1    Sig: Take 1 tablet (10 mg total) by mouth daily.     Cardiovascular:  ACE Inhibitors Failed - 03/18/2023  3:33 PM      Failed - Valid encounter within last 6 months    Recent Outpatient Visits           1 month ago Primary hypertension   [place] [facility] at [facility], [place], [place]   2 years ago Health maintenance examination   [facility] at [facility], [place]   2 years ago Acute maxillary sinusitis, recurrence not specified   [place] [facility] at [facility], [place]   3 years ago Elevated blood pressure reading without diagnosis of hypertension   [place] [facility] at [facility], [place]   4 years ago Essential hypertension   [place] [facility] at [facility], [place]              Passed - Cr in normal range and within 180 days    Creatinine, Ser  Date Value Ref Range Status  01/23/2023 1.02 0.76 - 1.27 mg/dL Final         Passed - K in normal range and within 180 days    Potassium  Date Value Ref Range Status  01/23/2023 5.0 3.5 - 5.2 mmol/L Final         Passed - Patient is not pregnant      Passed - Last BP in normal range    BP Readings from Last 1 Encounters:  01/23/23 138/78

## 2023-03-19 DIAGNOSIS — J189 Pneumonia, unspecified organism: Secondary | ICD-10-CM | POA: Diagnosis not present

## 2023-03-21 DIAGNOSIS — J189 Pneumonia, unspecified organism: Secondary | ICD-10-CM | POA: Diagnosis not present

## 2023-03-24 ENCOUNTER — Ambulatory Visit: Payer: BC Managed Care – PPO | Admitting: Family Medicine

## 2023-03-28 ENCOUNTER — Telehealth: Payer: Self-pay | Admitting: Family Medicine

## 2023-03-28 NOTE — Telephone Encounter (Signed)
Pt's wife is calling in because pt has been to UC and has pneumonia. Pt's wife would like to know if there is a way for pt to be squeezed in for an appointment today virtually, because she is concerned about the pneumonia and his cough. Pt says it has been going on for a while and she wants him to see Dr. Yetta Barre but cannot wait until Monday. Please follow up with [facility] as soon as possible.

## 2023-03-28 NOTE — Telephone Encounter (Signed)
Called pt scheduled an appt.  KP

## 2023-04-03 ENCOUNTER — Ambulatory Visit
Admission: RE | Admit: 2023-04-03 | Discharge: 2023-04-03 | Disposition: A | Discharge status: Home / Self Care | Payer: BC Managed Care – PPO | Source: Ambulatory Visit | Attending: Family Medicine | Admitting: Family Medicine

## 2023-04-03 ENCOUNTER — Ambulatory Visit
Admission: RE | Admit: 2023-04-03 | Discharge: 2023-04-03 | Disposition: A | Discharge status: Home / Self Care | Payer: BC Managed Care – PPO | Attending: Family Medicine | Admitting: Family Medicine

## 2023-04-03 ENCOUNTER — Encounter: Payer: Self-pay | Admitting: Family Medicine

## 2023-04-03 ENCOUNTER — Ambulatory Visit: Payer: BC Managed Care – PPO | Admitting: Family Medicine

## 2023-04-03 VITALS — BP 126/76 | HR 84 | Ht 65.0 in | Wt 181.0 lb

## 2023-04-03 DIAGNOSIS — D649 Anemia, unspecified: Secondary | ICD-10-CM | POA: Diagnosis not present

## 2023-04-03 DIAGNOSIS — J189 Pneumonia, unspecified organism: Secondary | ICD-10-CM

## 2023-04-03 DIAGNOSIS — I10 Essential (primary) hypertension: Secondary | ICD-10-CM | POA: Diagnosis not present

## 2023-04-03 MED ORDER — LISINOPRIL 10 MG PO TABS: 10.0000 mg | ORAL_TABLET | Freq: Every day | ORAL | 3 refills | Status: DC

## 2023-04-03 NOTE — Progress Notes (Signed)
 Date:  04/03/2023   Name:  Kenneth Simmons   DOB:  11-26-1975   MRN:  [id number]   Chief Complaint: Pneumonia and Hypertension  Pneumonia There is no chest tightness, cough, difficulty breathing, frequent throat clearing, hemoptysis, hoarse voice, shortness of breath, sputum production or wheezing. Primary symptoms comments: Night cough. This is a chronic problem. The current episode started more than 1 year ago. The problem occurs intermittently. Pertinent negatives include no chest pain, dyspnea on exertion, ear pain, fever, headaches, myalgias, postnasal drip, rhinorrhea, sneezing, sore throat or trouble swallowing. His symptoms are aggravated by change in weather. His symptoms are alleviated by nothing.  Hypertension This is a chronic problem. The current episode started more than 1 year ago. The problem has been gradually improving since onset. The problem is controlled. Pertinent negatives include no blurred vision, chest pain, headaches, palpitations or shortness of breath. Past treatments include ACE inhibitors. The current treatment provides moderate improvement. There are no compliance problems.     Lab Results  Component Value Date   NA 140 01/23/2023   K 5.0 01/23/2023   CO2 26 01/23/2023   GLUCOSE 133 (H) 01/23/2023   BUN 18 01/23/2023   CREATININE 1.02 01/23/2023   CALCIUM 9.6 01/23/2023   EGFR 91 01/23/2023   GFRNONAA 89 02/23/2019   Lab Results  Component Value Date   CHOL 206 (H) 01/23/2023   HDL 54 01/23/2023   LDLCALC 136 (H) 01/23/2023   TRIG 90 01/23/2023   CHOLHDL 3.8 01/23/2023   Lab Results  Component Value Date   TSH 0.810 01/23/2023   Lab Results  Component Value Date   HGBA1C 5.8 (H) 01/23/2023   Lab Results  Component Value Date   WBC 7.0 01/23/2023   HGB 15.1 01/23/2023   HCT 44.6 01/23/2023   MCV 95 01/23/2023   PLT 210 01/23/2023   Lab Results  Component Value Date   ALT 29 01/23/2023   AST 32 01/23/2023   ALKPHOS 53 01/23/2023    BILITOT 0.3 01/23/2023   No results found for: 25OHVITD2, 25OHVITD3, VD25OH   Review of Systems  Constitutional:  Negative for fever.  HENT:  Negative for ear pain, hoarse voice, postnasal drip, rhinorrhea, sneezing, sore throat and trouble swallowing.   Eyes:  Negative for blurred vision.  Respiratory:  Negative for cough, hemoptysis, sputum production, choking, chest tightness, shortness of breath and wheezing.   Cardiovascular:  Negative for chest pain, dyspnea on exertion and palpitations.  Musculoskeletal:  Negative for myalgias.  Neurological:  Negative for headaches.    Patient Active Problem List   Diagnosis Date Noted   Prediabetes 01/25/2023   Primary hypertension 01/23/2023    No Known Allergies  Past Surgical History:  Procedure Laterality Date   FRACTURE SURGERY     hand    KNEE ARTHROSCOPY WITH ANTERIOR CRUCIATE LIGAMENT (ACL) REPAIR Left 09/29/2014   Procedure: KNEE ARTHROSCOPY WITH ANTERIOR CRUCIATE LIGAMENT (ACL) REPAIR;  Surgeon: Franky Cranker, MD;  Location: [facility] ORS;  Service: Orthopedics;  Laterality: Left;    Social History   Tobacco Use   Smoking status: Former    Current packs/day: 1.50    Average packs/day: 1.5 packs/day for 12.0 years (18.0 ttl pk-yrs)    Types: Cigarettes   Smokeless tobacco: Never  Substance Use Topics   Alcohol use: Yes    Comment: occ   Drug use: No     Medication list has been reviewed and updated.  Current Meds  Medication Sig  BLACK CURRANT SEED OIL PO Take by mouth.   Cholecalciferol (VITAMIN D3 PO) Take by mouth.   CREATINE PO Take by mouth.   Cyanocobalamin (VITAMIN B12 PO) Take by mouth.   GARLIC PO Take by mouth.   lisinopril  (ZESTRIL ) 10 MG tablet Take 1 tablet (10 mg total) by mouth daily.   MAGNESIUM PO Take by mouth.   Multiple Vitamin (MULTIVITAMIN) capsule Take 1 capsule by mouth daily.   Omega-3 Fatty Acids (FISH OIL PO) Take 1 capsule by mouth daily.   POTASSIUM PO Take by mouth.    Turmeric (QC TUMERIC COMPLEX PO) Take by mouth.   vitamin C (ASCORBIC ACID) 500 MG tablet Take 500 mg by mouth daily.       04/03/2023    2:31 PM 01/23/2023   11:00 AM 02/16/2021   10:41 AM 04/10/2020    1:41 PM  GAD 7 : Generalized Anxiety Score  Nervous, Anxious, on Edge 0 1 0 0  Control/stop worrying 0 1 0 0  Worry too much - different things 0 1 0 0  Trouble relaxing 3 2 0 0  Restless 3 1 0 0  Easily annoyed or irritable 3 0 0 0  Afraid - awful might happen 0 0 0 0  Total GAD 7 Score 9 6 0 0  Anxiety Difficulty Not difficult at all Not difficult at all         04/03/2023    2:30 PM 01/23/2023   10:59 AM 02/16/2021   10:41 AM  Depression screen PHQ 2/9  Decreased Interest 0 0 0  Down, Depressed, Hopeless 0 0 0  PHQ - 2 Score 0 0 0  Altered sleeping 0 0 0  Tired, decreased energy 2 1 0  Change in appetite 0 0 0  Feeling bad or failure about yourself  0 0 0  Trouble concentrating 0 0 0  Moving slowly or fidgety/restless 0 0 0  Suicidal thoughts 0 0 0  PHQ-9 Score 2 1 0  Difficult doing work/chores Not difficult at all Not difficult at all Not difficult at all    BP Readings from Last 3 Encounters:  04/03/23 126/76  01/23/23 138/78  02/16/21 126/64    Physical Exam Vitals and nursing note reviewed.  HENT:     Head: Normocephalic.     Right Ear: Tympanic membrane and external ear normal. There is no impacted cerumen.     Left Ear: Tympanic membrane and external ear normal. There is no impacted cerumen.     Nose: Nose normal.     Mouth/Throat:     Mouth: Mucous membranes are moist.  Eyes:     General: No scleral icterus.       Right eye: No discharge.        Left eye: No discharge.     Conjunctiva/sclera: Conjunctivae normal.     Pupils: Pupils are equal, round, and reactive to light.  Neck:     Thyroid : No thyromegaly.     Vascular: No JVD.     Trachea: No tracheal deviation.  Cardiovascular:     Rate and Rhythm: Normal rate and regular rhythm.     Heart  sounds: Normal heart sounds. No murmur heard.    No friction rub. No gallop.  Pulmonary:     Effort: No respiratory distress.     Breath sounds: Normal breath sounds. No decreased air movement. No decreased breath sounds, wheezing, rhonchi or rales.  Abdominal:     General: Bowel sounds  are normal.     Palpations: Abdomen is soft. There is no mass.     Tenderness: There is no abdominal tenderness. There is no guarding or rebound.  Musculoskeletal:        General: No tenderness. Normal range of motion.     Cervical back: Normal range of motion and neck supple.  Lymphadenopathy:     Cervical: No cervical adenopathy.  Skin:    General: Skin is warm.     Findings: No rash.  Neurological:     Mental Status: He is alert and oriented to person, place, and time.     Cranial Nerves: No cranial nerve deficit.     Deep Tendon Reflexes: Reflexes are normal and symmetric.     Wt Readings from Last 3 Encounters:  04/03/23 181 lb (82.1 kg)  01/23/23 192 lb (87.1 kg)  02/16/21 167 lb 6.4 oz (75.9 kg)    BP 126/76   Pulse 84   Ht 5' 5 (1.651 m)   Wt 181 lb (82.1 kg)   SpO2 98%   BMI 30.12 kg/m   Assessment and Plan:  1. Anemia, unspecified type (Primary) New onset.  Hemoglobin slightly decreased into the mid 12 range.  Normocytic.  Will repeat CBC to see if anemia has responded as well as if the leukocyte count has decreased patient has not had any steroids from the pain clinic for several weeks and this should not be of affected by prednisone administration.  Will recheck a CBC at this time and then recheck patient in 2 weeks and address accordingly. - CBC with Differential/Platelet - DG Chest 2 View  2. Pneumonia of right middle lobe due to infectious organism In reviewing notes without access to the actual reading patient was noted to have a right middle lobe pneumonia for which she was treated on what looks like Augmentin and azithromycin and perhaps levofloxacin.  Patient is doing  well now on examination patient is clear to auscultation and percussion.  We will obtain a chest x-ray to see if there is been resolution of the pneumonia and will recheck patient in 2 weeks to see if this has been maintained. - CBC with Differential/Platelet - DG Chest 2 View  3. Primary hypertension Chronic.  Uncontrolled.  Stable.  Currently is controlled upon restarting of lisinopril  10 mg once a day.  This has been called in again for patient for him to maintain on 10 mg once a day and will repeat recheck in 2 weeks.   Cathryne Molt, MD

## 2023-04-04 ENCOUNTER — Encounter: Payer: Self-pay | Admitting: Family Medicine

## 2023-04-04 LAB — CBC WITH DIFFERENTIAL/PLATELET
Basophils Absolute: 0 10*3/uL (ref 0.0–0.2)
Basos: 0 %
EOS (ABSOLUTE): 0 10*3/uL (ref 0.0–0.4)
Eos: 1 %
Hematocrit: 42.5 % (ref 37.5–51.0)
Hemoglobin: 15 g/dL (ref 13.0–17.7)
Immature Grans (Abs): 0 10*3/uL (ref 0.0–0.1)
Immature Granulocytes: 0 %
Lymphocytes Absolute: 1.8 10*3/uL (ref 0.7–3.1)
Lymphs: 24 %
MCH: 33.9 pg — ABNORMAL HIGH (ref 26.6–33.0)
MCHC: 35.3 g/dL (ref 31.5–35.7)
MCV: 96 fL (ref 79–97)
Monocytes Absolute: 0.5 10*3/uL (ref 0.1–0.9)
Monocytes: 7 %
Neutrophils Absolute: 5.1 10*3/uL (ref 1.4–7.0)
Neutrophils: 68 %
Platelets: 235 10*3/uL (ref 150–450)
RBC: 4.42 x10E6/uL (ref 4.14–5.80)
RDW: 13 % (ref 11.6–15.4)
WBC: 7.4 10*3/uL (ref 3.4–10.8)

## 2024-04-06 ENCOUNTER — Telehealth: Payer: Self-pay | Admitting: Family Medicine

## 2024-04-06 ENCOUNTER — Other Ambulatory Visit: Payer: Self-pay

## 2024-04-06 DIAGNOSIS — I10 Essential (primary) hypertension: Secondary | ICD-10-CM

## 2024-04-06 MED ORDER — LISINOPRIL 10 MG PO TABS: 10.0000 mg | ORAL_TABLET | Freq: Every day | ORAL | 0 refills | Status: DC

## 2024-04-06 NOTE — Telephone Encounter (Signed)
 Copied from CRM [id number]. Topic: Clinical - Medication Refill >> Apr 06, 2024  1:04 PM Myrick T wrote: Patient left his medication in a hotel room while out of town  Medication: lisinopril  (ZESTRIL ) 10 MG tablet  Has the patient contacted their pharmacy? Yes  This is the patient's preferred pharmacy:  [facility] [address] ([address]), [place] - [address] [place]) [place] [address] Phone: [phone] Fax: [phone]  Is this the correct pharmacy for this prescription? Yes  Has the prescription been filled recently? Yes  Is the patient out of the medication? Yes  Has the patient been seen for an appointment in the last year OR does the patient have an upcoming appointment? No  Can we respond through MyChart? Yes  Agent: Please be advised that Rx refills may take up to 3 business days. We ask that you follow-up with your pharmacy.

## 2024-04-06 NOTE — Telephone Encounter (Signed)
 Refill sent ? ?KP ?

## 2024-05-06 ENCOUNTER — Other Ambulatory Visit: Payer: Self-pay | Admitting: Family Medicine

## 2024-05-06 DIAGNOSIS — I10 Essential (primary) hypertension: Secondary | ICD-10-CM

## 2024-05-06 NOTE — Telephone Encounter (Unsigned)
 Copied from CRM [id number]. Topic: Clinical - Medication Refill >> May 06, 2024 11:59 AM Brentin F wrote: Medication: lisinopril  (ZESTRIL ) 10 MG tablet [[id number]]  Has the patient contacted their pharmacy? Yes  (Agent: If yes, when and what did the pharmacy advise?) contact office   This is the patient's preferred pharmacy:  [facility] [address] [place] - [address] [place]) [place] [address] Phone: [phone] Fax: [phone]  Is this the correct pharmacy for this prescription? Yes If no, delete pharmacy and type the correct one.   Has the prescription been filled recently? Yes  Is the patient out of the medication? No  Has the patient been seen for an appointment in the last year OR does the patient have an upcoming appointment? Yes  Can we respond through MyChart? Yes  Agent: Please be advised that Rx refills may take up to 3 business days. We ask that you follow-up with your pharmacy.  Patient is requesting enough to last until his appointment in March. Please follow up with patient.

## 2024-05-07 MED ORDER — LISINOPRIL 10 MG PO TABS: 10.0000 mg | ORAL_TABLET | Freq: Every day | ORAL | 0 refills | Status: AC

## 2024-05-07 NOTE — Telephone Encounter (Signed)
 Requested medications are due for refill today.  yes  Requested medications are on the active medications list.  yes  Last refill. 04/06/2024 #30 0 rf  Future visit scheduled.   yes  Notes to clinic.  Expired labs.    Requested Prescriptions  Pending Prescriptions Disp Refills   lisinopril  (ZESTRIL ) 10 MG tablet 30 tablet 0    Sig: Take 1 tablet (10 mg total) by mouth daily.     Cardiovascular:  ACE Inhibitors Failed - 05/07/2024  3:12 PM      Failed - Cr in normal range and within 180 days    Creatinine, Ser  Date Value Ref Range Status  01/23/2023 1.02 0.76 - 1.27 mg/dL Final         Failed - K in normal range and within 180 days    Potassium  Date Value Ref Range Status  01/23/2023 5.0 3.5 - 5.2 mmol/L Final         Failed - Valid encounter within last 6 months    Recent Outpatient Visits   None            Passed - Patient is not pregnant      Passed - Last BP in normal range    BP Readings from Last 1 Encounters:  04/03/23 126/76

## 2024-05-31 ENCOUNTER — Encounter: Admitting: Student
